# Patient Record
Sex: Female | Born: 1975 | Race: White | Hispanic: No | Smoking: Current every day smoker
Health system: Southern US, Community
[De-identification: ages and names within clinical notes are randomized; demographics above are authoritative.]

## PROBLEM LIST (undated history)

## (undated) DIAGNOSIS — R1013 Epigastric pain: Secondary | ICD-10-CM

## (undated) DIAGNOSIS — F419 Anxiety disorder, unspecified: Secondary | ICD-10-CM

## (undated) DIAGNOSIS — J45909 Unspecified asthma, uncomplicated: Secondary | ICD-10-CM

## (undated) DIAGNOSIS — E7800 Pure hypercholesterolemia, unspecified: Secondary | ICD-10-CM

## (undated) DIAGNOSIS — G43909 Migraine, unspecified, not intractable, without status migrainosus: Secondary | ICD-10-CM

## (undated) DIAGNOSIS — F329 Major depressive disorder, single episode, unspecified: Secondary | ICD-10-CM

## (undated) DIAGNOSIS — J449 Chronic obstructive pulmonary disease, unspecified: Secondary | ICD-10-CM

## (undated) DIAGNOSIS — M6289 Other specified disorders of muscle: Secondary | ICD-10-CM

## (undated) DIAGNOSIS — F32A Depression, unspecified: Secondary | ICD-10-CM

## (undated) HISTORY — DX: Depression, unspecified: F32.A

## (undated) HISTORY — DX: Other specified disorders of muscle: M62.89

## (undated) HISTORY — DX: Chronic obstructive pulmonary disease, unspecified: J44.9

## (undated) HISTORY — PX: WISDOM TOOTH EXTRACTION: SHX21

## (undated) HISTORY — DX: Anxiety disorder, unspecified: F41.9

## (undated) HISTORY — PX: OTHER SURGICAL HISTORY: SHX169

## (undated) HISTORY — DX: Pure hypercholesterolemia, unspecified: E78.00

## (undated) HISTORY — DX: Epigastric pain: R10.13

## (undated) HISTORY — DX: Unspecified asthma, uncomplicated: J45.909

## (undated) HISTORY — PX: TONSILLECTOMY: SUR1361

## (undated) HISTORY — PX: CHOLECYSTECTOMY: SHX55

## (undated) HISTORY — DX: Migraine, unspecified, not intractable, without status migrainosus: G43.909

---
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
## 2015-03-07 NOTE — Progress Notes (Unsigned)
B/P : 113/83   Pulse: 82   Taken on 12/09/2014 By: Samule Dry, RN

## 2017-10-13 ENCOUNTER — Other Ambulatory Visit (HOSPITAL_COMMUNITY): Payer: Self-pay | Admitting: Family Medicine

## 2017-10-13 DIAGNOSIS — R1013 Epigastric pain: Secondary | ICD-10-CM

## 2017-10-19 ENCOUNTER — Encounter (INDEPENDENT_AMBULATORY_CARE_PROVIDER_SITE_OTHER): Payer: Self-pay | Admitting: Internal Medicine

## 2017-10-19 ENCOUNTER — Ambulatory Visit (INDEPENDENT_AMBULATORY_CARE_PROVIDER_SITE_OTHER): Payer: BLUE CROSS/BLUE SHIELD | Admitting: Internal Medicine

## 2017-10-19 ENCOUNTER — Encounter (INDEPENDENT_AMBULATORY_CARE_PROVIDER_SITE_OTHER): Payer: Self-pay | Admitting: *Deleted

## 2017-10-19 VITALS — BP 122/80 | HR 72 | Temp 97.7°F | Ht 65.0 in | Wt 231.5 lb

## 2017-10-19 DIAGNOSIS — K219 Gastro-esophageal reflux disease without esophagitis: Secondary | ICD-10-CM

## 2017-10-19 DIAGNOSIS — R1013 Epigastric pain: Secondary | ICD-10-CM | POA: Diagnosis not present

## 2017-10-19 HISTORY — DX: Epigastric pain: R10.13

## 2017-10-19 MED ORDER — PANTOPRAZOLE SODIUM 40 MG PO TBEC: 40.0000 mg | DELAYED_RELEASE_TABLET | Freq: Two times a day (BID) | ORAL | 3 refills | Status: DC

## 2017-10-19 NOTE — Patient Instructions (Addendum)
Stop the Omeprazole. Start the Protonix 40mg  BID. You can stop the Hydrocodone also.  Stop the Motrin Excedrine Migraine.   EGD.

## 2017-10-19 NOTE — Progress Notes (Signed)
   Subjective:    Patient ID: Kathryn Rodriguez, female    DOB: 15-Mar-1975, 42 y.o.   MRN: [id number]  HPI Referred by Dr. Karie Kirks for abdominal pain.  Pain middle of abdomen. Symptoms for about a month. Pain continuous but worse about 20 minutes after eating. Has been taking Omeprazole BID. She points to her epigastric and umbilical region where her pain is located. Has been seen at [facility] for same in July of this year. She was told she had gastritis or an ulcer. She continued the Omeprazole and started the Carafate. There was no change in her symptoms. She has tried a bland diet and this has not helped. No weight loss. Appetite not good. It hurts to eat.  Has a BM every four days since her symptoms started and she started the pain medication. She is taking a stool softener for her constipation. She is scheduled for CT scan on 10/26/2017. Had been taking Motrin and Excedrin everyday for her back aches and headaches.  She was taking Motrin 800mg  once or twice a day and Excedrin Migraine twice a day. Stopped the Motrin 2 weeks ago but has continued the Excedrin Migraine. Has only taken the Excedrin once or twice since symptoms started.  Family hx of colon cancer in a grandfather in his 18s.       Review of Systems Past Medical History:  Diagnosis Date  . Abdominal pain, epigastric 10/19/2017  . Asthma   . COPD (chronic obstructive pulmonary disease) ([place])   . Migraines       No Known Allergies  Current Outpatient Medications on File Prior to Visit  Medication Sig Dispense Refill  . ALPRAZolam (XANAX) 1 MG tablet Take 1 mg by mouth 3 (three) times daily.    Marland Kitchen aspirin-acetaminophen-caffeine (EXCEDRIN MIGRAINE) 250-250-65 MG tablet Take by mouth every 6 (six) hours as needed for headache.    . cetirizine (ZYRTEC) 10 MG tablet Take 10 mg by mouth daily.    Marland Kitchen HYDROcodone-acetaminophen (NORCO) 10-325 MG tablet Take 1 tablet by mouth at bedtime.    Marland Kitchen HYDROcodone-acetaminophen  (NORCO/VICODIN) 5-325 MG tablet Take 1 tablet by mouth every 6 (six) hours as needed for moderate pain.    Marland Kitchen omeprazole (PRILOSEC) 40 MG capsule Take 40 mg by mouth daily.    . ondansetron (ZOFRAN) 4 MG tablet Take 4 mg by mouth every 8 (eight) hours as needed for nausea or vomiting.    . topiramate (TOPAMAX) 100 MG tablet Take 100 mg by mouth 2 (two) times daily.     No current facility-administered medications on file prior to visit.         Objective:   Physical Exam Blood pressure 122/80, pulse 72, temperature 97.7 F (36.5 C), height 5\' 5"  (1.651 m), weight 231 lb 8 oz (105 kg). Alert and oriented. Skin warm and dry. Oral mucosa is moist.   . Sclera anicteric, conjunctivae is pink. Thyroid not enlarged. No cervical lymphadenopathy. Lungs clear. Heart regular rate and rhythm.  Abdomen is soft. Bowel sounds are positive. No hepatomegaly. No abdominal masses felt. Epigastric tenderness.  No edema to lower extremities.           Assessment & Plan:  Epigastric pain. GERD. Needs EGD to rule GERD. The risks of bleeding, perforation and infection were reviewed with patient. Family hx of colon cancer. Will schedule Colonoscopy in the near future.  Stop the Excedrin Migraine, Motrin, and Hydrocodone.

## 2017-10-26 ENCOUNTER — Encounter (HOSPITAL_COMMUNITY)
Admission: RE | Disposition: A | Discharge status: Home / Self Care | Payer: Self-pay | Source: Ambulatory Visit | Attending: Internal Medicine

## 2017-10-26 ENCOUNTER — Ambulatory Visit (HOSPITAL_COMMUNITY)
Admission: RE | Admit: 2017-10-26 | Discharge: 2017-10-26 | Disposition: A | Discharge status: Home / Self Care | Payer: BLUE CROSS/BLUE SHIELD | Source: Ambulatory Visit | Attending: Family Medicine | Admitting: Family Medicine

## 2017-10-26 ENCOUNTER — Ambulatory Visit (HOSPITAL_COMMUNITY)
Admission: RE | Admit: 2017-10-26 | Discharge: 2017-10-26 | Disposition: A | Discharge status: Home / Self Care | Payer: BLUE CROSS/BLUE SHIELD | Source: Ambulatory Visit | Attending: Internal Medicine | Admitting: Internal Medicine

## 2017-10-26 ENCOUNTER — Other Ambulatory Visit: Payer: Self-pay

## 2017-10-26 ENCOUNTER — Encounter (HOSPITAL_COMMUNITY): Payer: Self-pay | Admitting: *Deleted

## 2017-10-26 DIAGNOSIS — J449 Chronic obstructive pulmonary disease, unspecified: Secondary | ICD-10-CM | POA: Insufficient documentation

## 2017-10-26 DIAGNOSIS — F17210 Nicotine dependence, cigarettes, uncomplicated: Secondary | ICD-10-CM | POA: Insufficient documentation

## 2017-10-26 DIAGNOSIS — K449 Diaphragmatic hernia without obstruction or gangrene: Secondary | ICD-10-CM | POA: Insufficient documentation

## 2017-10-26 DIAGNOSIS — K210 Gastro-esophageal reflux disease with esophagitis: Secondary | ICD-10-CM | POA: Insufficient documentation

## 2017-10-26 DIAGNOSIS — Z7951 Long term (current) use of inhaled steroids: Secondary | ICD-10-CM | POA: Diagnosis not present

## 2017-10-26 DIAGNOSIS — G43909 Migraine, unspecified, not intractable, without status migrainosus: Secondary | ICD-10-CM | POA: Insufficient documentation

## 2017-10-26 DIAGNOSIS — K228 Other specified diseases of esophagus: Secondary | ICD-10-CM | POA: Insufficient documentation

## 2017-10-26 DIAGNOSIS — R1013 Epigastric pain: Secondary | ICD-10-CM | POA: Diagnosis present

## 2017-10-26 DIAGNOSIS — Z8711 Personal history of peptic ulcer disease: Secondary | ICD-10-CM | POA: Diagnosis not present

## 2017-10-26 DIAGNOSIS — K219 Gastro-esophageal reflux disease without esophagitis: Secondary | ICD-10-CM | POA: Insufficient documentation

## 2017-10-26 HISTORY — PX: ESOPHAGOGASTRODUODENOSCOPY: SHX5428

## 2017-10-26 HISTORY — PX: BIOPSY: SHX5522

## 2017-10-26 SURGERY — EGD (ESOPHAGOGASTRODUODENOSCOPY)
Anesthesia: Moderate Sedation

## 2017-10-26 MED ORDER — IOPAMIDOL (ISOVUE-300) INJECTION 61%
100.0000 mL | Freq: Once | INTRAVENOUS | Status: AC | PRN
  Administered 2017-10-26: 100 mL via INTRAVENOUS

## 2017-10-26 MED ORDER — MIDAZOLAM HCL 5 MG/5ML IJ SOLN
INTRAMUSCULAR | Status: DC | PRN
  Administered 2017-10-26: 1 mg via INTRAVENOUS
  Administered 2017-10-26: 3 mg via INTRAVENOUS
  Administered 2017-10-26 (×2): 2 mg via INTRAVENOUS

## 2017-10-26 MED ORDER — SODIUM CHLORIDE 0.9 % IV SOLN
INTRAVENOUS | Status: DC
  Administered 2017-10-26: 11:00:00 via INTRAVENOUS

## 2017-10-26 MED ORDER — LIDOCAINE VISCOUS HCL 2 % MT SOLN
OROMUCOSAL | Status: DC | PRN
  Administered 2017-10-26: 1 via OROMUCOSAL

## 2017-10-26 MED ORDER — LIDOCAINE VISCOUS HCL 2 % MT SOLN
OROMUCOSAL | Status: AC
  Filled 2017-10-26: qty 15

## 2017-10-26 MED ORDER — MEPERIDINE HCL 50 MG/ML IJ SOLN
INTRAMUSCULAR | Status: DC | PRN
  Administered 2017-10-26 (×2): 25 mg

## 2017-10-26 MED ORDER — METOCLOPRAMIDE HCL 10 MG PO TABS: 10.0000 mg | ORAL_TABLET | Freq: Three times a day (TID) | ORAL | 0 refills | Status: DC

## 2017-10-26 MED ORDER — MIDAZOLAM HCL 5 MG/5ML IJ SOLN
INTRAMUSCULAR | Status: AC
  Filled 2017-10-26: qty 10

## 2017-10-26 MED ORDER — MEPERIDINE HCL 50 MG/ML IJ SOLN
INTRAMUSCULAR | Status: AC
  Filled 2017-10-26: qty 1

## 2017-10-26 NOTE — Op Note (Signed)
[facility] Patient Name: Kathryn Rodriguez Procedure Date: 10/26/2017 11:26 AM MRN: [id number] Date of Birth: 08/22/1975 Attending MD: Hildred Laser , MD CSN: [id number] Age: 42 Admit Type: Outpatient Procedure:                Upper GI endoscopy Indications:              Epigastric abdominal pain Providers:                Hildred Laser, MD, Janeece Riggers, RN, Nelma Rothman,                            Technician Referring MD:             Lemmie Evens, MD Medicines:                Lidocaine spray, Meperidine 50 mg IV, Midazolam 8                            mg IV Complications:            No immediate complications. Estimated Blood Loss:     Estimated blood loss was minimal. Procedure:                Pre-Anesthesia Assessment:                           - Prior to the procedure, a History and Physical                            was performed, and patient medications and                            allergies were reviewed. The patient's tolerance of                            previous anesthesia was also reviewed. The risks                            and benefits of the procedure and the sedation                            options and risks were discussed with the patient.                            All questions were answered, and informed consent                            was obtained. Prior Anticoagulants: The patient                            last took previous NSAID medication 1 day prior to                            the procedure. ASA Grade Assessment: II - A patient  with mild systemic disease. After reviewing the                            risks and benefits, the patient was deemed in                            satisfactory condition to undergo the procedure.                           After obtaining informed consent, the endoscope was                            passed under direct vision. Throughout the                            procedure, the patient's  blood pressure, pulse, and                            oxygen saturations were monitored continuously. The                            [id number]-[id number] ([id number]) scope was introduced through the                            mouth, and advanced to the second part of duodenum.                            The upper GI endoscopy was accomplished without                            difficulty. The patient tolerated the procedure                            well. Scope In: 12:00:08 PM Scope Out: 12:08:33 PM Total Procedure Duration: 0 hours 8 minutes 25 seconds  Findings:      The examined esophagus was normal.      The Z-line was irregular and was found 34 cm from the incisors.      A 2 cm hiatal hernia was present.      A large amount of food (residue) was found in the gastric body and on       the anterior wall of the stomach.      The exam of the stomach was otherwise normal.      The pylorus was normal.      The duodenal bulb and second portion of the duodenum were normal. Impression:               - Normal esophagus.                           - Z-line irregular, 34 cm from the incisors.                           - 2 cm hiatal hernia.                           -  A large amount of food (residue) in the stomach.                           - Normal pylorus.                           - Normal duodenal bulb and second portion of the                            duodenum.                           - No specimens collected. Moderate Sedation:      Moderate (conscious) sedation was administered by the endoscopy nurse       and supervised by the endoscopist. The following parameters were       monitored: oxygen saturation, heart rate, blood pressure, CO2       capnography and response to care. Total physician intraservice time was       16 minutes. Recommendation:           - Patient has a contact number available for                            emergencies. The signs and symptoms of potential                             delayed complications were discussed with the                            patient. Return to normal activities tomorrow.                            Written discharge instructions were provided to the                            patient.                           - Clear liquid diet today.                           - Low fat diet today.                           - Continue present medications.                           - Metoclopromid 10 mg po ac.                           - Await pathology results.                           - Pancreatic EUS to bescheduled. Procedure Code(s):        --- Professional ---  01027, Esophagogastroduodenoscopy, flexible,                            transoral; diagnostic, including collection of                            specimen(s) by brushing or washing, when performed                            (separate procedure)                           G0500, Moderate sedation services provided by the                            same physician or other qualified health care                            professional performing a gastrointestinal                            endoscopic service that sedation supports,                            requiring the presence of an independent trained                            observer to assist in the monitoring of the                            patient's level of consciousness and physiological                            status; initial 15 minutes of intra-service time;                            patient age 32 years or older (additional time may                            be reported with [phone], as appropriate) Diagnosis Code(s):        --- Professional ---                           K22.8, Other specified diseases of esophagus                           K44.9, Diaphragmatic hernia without obstruction or                            gangrene                           R10.13, Epigastric pain CPT copyright  2017 American Medical Association. All rights reserved. The codes documented in this report are preliminary and upon coder review may  be revised to meet current compliance requirements. Hildred Laser, MD  Hildred Laser, MD 10/26/2017 12:21:01 PM This report has been signed electronically. Number of Addenda: 0

## 2017-10-26 NOTE — Discharge Instructions (Signed)
Keep Excedrin use to minimum. Resume other medications as before. Metoclopramide 10 mg by mouth 30 minutes before each meal.  If you experience tremors or other side effects stop the medication and call office. Clear liquids today. Begin low-fat diet starting tomorrow. No driving for 24 hours. Physician will call with biopsy results. Pancreatic endoscopic ultrasound to be scheduled.  Office will call. No aspirin products for twenty-four hours.  Upper Endoscopy, Care After Refer to this sheet in the next few weeks. These instructions provide you with information about caring for yourself after your procedure. Your health care provider may also give you more specific instructions. Your treatment has been planned according to current medical practices, but problems sometimes occur. Call your health care provider if you have any problems or questions after your procedure. What can I expect after the procedure? After the procedure, it is common to have:  A sore throat.  Bloating.  Nausea.  Follow these instructions at home:  Follow instructions from your health care provider about what to eat or drink after your procedure.  Return to your normal activities as told by your health care provider. Ask your health care provider what activities are safe for you.  Take over-the-counter and prescription medicines only as told by your health care provider.  Do not drive for 24 hours if you received a sedative.  Keep all follow-up visits as told by your health care provider. This is important. Contact a health care provider if:  You have a sore throat that lasts longer than one day.  You have trouble swallowing. Get help right away if:  You have a fever.  You vomit blood or your vomit looks like coffee grounds.  You have bloody, black, or tarry stools.  You have a severe sore throat or you cannot swallow.  You have difficulty breathing.  You have severe pain in your chest or  belly.   Low-Fat Diet for Pancreatitis or Gallbladder Conditions A low-fat diet can be helpful if you have pancreatitis or a gallbladder condition. With these conditions, your pancreas and gallbladder have trouble digesting fats. A healthy eating plan with less fat will help rest your pancreas and gallbladder and reduce your symptoms. What do I need to know about this diet?  Eat a low-fat diet. ? Reduce your fat intake to less than 20-30% of your total daily calories. This is less than 50-60 g of fat per day. ? Remember that you need some fat in your diet. Ask your dietician what your daily goal should be. ? Choose nonfat and low-fat healthy foods. Look for the words nonfat, low fat, or fat free. ? As a guide, look on the label and choose foods with less than 3 g of fat per serving. Eat only one serving.  Avoid alcohol.  Do not smoke. If you need help quitting, talk with your health care provider.  Eat small frequent meals instead of three large heavy meals. What foods can I eat? Grains Include healthy grains and starches such as potatoes, wheat bread, fiber-rich cereal, and brown rice. Choose whole grain options whenever possible. In adults, whole grains should account for 45-65% of your daily calories. Fruits and Vegetables Eat plenty of fruits and vegetables. Fresh fruits and vegetables add fiber to your diet. Meats and Other Protein Sources Eat lean meat such as chicken and pork. Trim any fat off of meat before cooking it. Eggs, fish, and beans are other sources of protein. In adults, these foods should account  for 10-35% of your daily calories. Dairy Choose low-fat milk and dairy options. Dairy includes fat and protein, as well as calcium. Fats and Oils Limit high-fat foods such as fried foods, sweets, baked goods, sugary drinks. Other Creamy sauces and condiments, such as mayonnaise, can add extra fat. Think about whether or not you need to use them, or use smaller amounts  or low fat options. What foods are not recommended?  High fat foods, such as: ? [employer]. ? Ice cream. ? [place] toast. ? Sweet rolls. ? Pizza. ? Cheese bread. ? Foods covered with batter, butter, creamy sauces, or cheese. ? Fried foods. ? Sugary drinks and desserts.  Foods that cause gas or bloating

## 2017-10-26 NOTE — H&P (Signed)
Kathryn Rodriguez is an 42 y.o. female.   Chief Complaint: Patient is here for EGD. HPI: Patient is a 42 year old Caucasian female who is been experiencing epigastric pain for 6 to 7 weeks.  Pain is tries to be sharp crampy and radiates inferiorly and hypogastric region.  She has had nausea but no vomiting.  She has not felt any better with therapy for peptic ulcer disease.  She was seen in emergency room at [facility] and told she had gastritis or ulcer.  She had blood work which is unremarkable.  She has noted more pain with ice cream.  She has been on ibuprofen since she was 42 years old.  She generally takes a couple of doses a week.  She also has been taking Excedrin as many as 2/day for migraine.  She denies melena or rectal bleeding or weight loss. Just had abdominal pelvic CT which reveals lesion in left lobe of liver consistent with hemangioma.  She also has some hypodense lesion in superior aspect of pancreatic body measuring 24 x 14 mm.  There is no duct dilation or inflammatory changes surrounding this lesion. Patient smokes 1 pack of cigarettes per day and drinks alcohol occasionally.  Past Medical History:  Diagnosis Date  . Abdominal pain, epigastric 10/19/2017  . Asthma   . COPD (chronic obstructive pulmonary disease) ([place])   . Migraines     Past Surgical History:  Procedure Laterality Date  . c sections     x 3  . CHOLECYSTECTOMY    . complete hysterectomy    . TONSILLECTOMY    . WISDOM TOOTH EXTRACTION      History reviewed. No pertinent family history. Social History:  reports that she has been smoking. She has never used smokeless tobacco. She reports that she does not drink alcohol. Her drug history is not on file.  Allergies: No Known Allergies  Medications Prior to Admission  Medication Sig Dispense Refill  . ALPRAZolam (XANAX) 1 MG tablet Take 1 mg by mouth 3 (three) times daily.    Marland Kitchen aspirin-acetaminophen-caffeine (EXCEDRIN MIGRAINE) 250-250-65 MG tablet  Take 1 tablet by mouth every 6 (six) hours as needed for headache.     . cetirizine (ZYRTEC) 10 MG tablet Take 10 mg by mouth daily.    Marland Kitchen docusate sodium (COLACE) 100 MG capsule Take 100 mg by mouth daily as needed for mild constipation.    Marland Kitchen HYDROcodone-acetaminophen (NORCO) 10-325 MG tablet Take 1 tablet by mouth at bedtime.    Marland Kitchen HYDROcodone-acetaminophen (NORCO/VICODIN) 5-325 MG tablet Take 0.5-1 tablets by mouth every 6 (six) hours as needed for moderate pain.     Marland Kitchen ondansetron (ZOFRAN) 4 MG tablet Take 4 mg by mouth every 8 (eight) hours as needed for nausea or vomiting.    . pantoprazole (PROTONIX) 40 MG tablet Take 1 tablet (40 mg total) by mouth 2 (two) times daily before a meal. 60 tablet 3  . SEREVENT DISKUS 50 MCG/DOSE diskus inhaler Inhale 2 puffs into the lungs every morning.  11  . SUMAtriptan (IMITREX) 100 MG tablet Take 100 mg by mouth every 2 (two) hours as needed for migraine. May repeat in 2 hours if headache persists or recurs.    . topiramate (TOPAMAX) 100 MG tablet Take 100 mg by mouth 2 (two) times daily.    . VENTOLIN HFA 108 (90 Base) MCG/ACT inhaler Inhale 2 puffs into the lungs every 4 (four) hours as needed for wheezing.   11    No  results found for this or any previous visit (from the past 8 hour(s)). Ct Abdomen Pelvis W Contrast  Result Date: 10/26/2017 CLINICAL DATA:  Epigastric pain radiating into the umbilicus for 1 month. EXAM: CT ABDOMEN AND PELVIS WITH CONTRAST TECHNIQUE: Multidetector CT imaging of the abdomen and pelvis was performed using the standard protocol following bolus administration of intravenous contrast. CONTRAST:  147mL ISOVUE-300 IOPAMIDOL (ISOVUE-300) INJECTION 61% COMPARISON:  None. FINDINGS: Lower chest: No acute abnormality. Hepatobiliary: 2 cm hypodense indeterminate left hepatic mass measuring 39 Hounsfield units with mild peripheral nodular enhancement suggesting a hemangioma. No other focal hepatic mass. No intrahepatic or extrahepatic  biliary ductal dilatation. Prior cholecystectomy. Pancreas: No pancreatic ductal dilatation or surrounding inflammatory changes. 2.4 x 1.4 cm hypodense mass along the superior margin of the mid body of the pancreas. Spleen: Normal in size without focal abnormality. Adrenals/Urinary Tract: Adrenal glands are unremarkable. 7 mm hypodense mass in the anterior interpolar aspect of the right kidney likely reflecting a small cyst, but too small the characterize. Kidneys are otherwise normal, without renal calculi, focal lesion, or hydronephrosis. Bladder is unremarkable. Stomach/Bowel: Stomach is within normal limits. Appendix appears normal. No evidence of bowel wall thickening, distention, or inflammatory changes. Vascular/Lymphatic: Normal caliber abdominal aorta with mild atherosclerosis. No lymphadenopathy. Reproductive: Status post hysterectomy. No adnexal masses. Other: No abdominal wall hernia or abnormality. No abdominopelvic ascites. Musculoskeletal: No acute osseous abnormality. No aggressive osseous lesion. IMPRESSION: 1. No acute abdominal or pelvic pathology. 2. 2.4 x 1.4 cm hypodense mass along the superior margin of the mid body of the pancreas. Recommend further evaluation with MRI of the abdomen without and with intravenous contrast. 3.  Aortic Atherosclerosis (ICD10-I70.0). Electronically Signed   By: Kathreen Devoid   On: 10/26/2017 09:23    ROS  Blood pressure 111/83, pulse (!) 53, temperature 97.8 F (36.6 C), temperature source Oral, resp. rate 16, SpO2 100 %. Physical Exam  Constitutional: She appears well-developed and well-nourished.  HENT:  Mouth/Throat: Oropharynx is clear and moist.  Eyes: Conjunctivae are normal. No scleral icterus.  Neck: No thyromegaly present.  Cardiovascular: Normal rate, regular rhythm and normal heart sounds.  No murmur heard. Respiratory: Effort normal and breath sounds normal.  GI:  Abdomen is full.  She has laparoscopy scars as well as Pfannenstiel scar  across lower abdomen.  Abdomen is soft with mild midepigastric tenderness.  No organomegaly or masses  Musculoskeletal: She exhibits no edema.  Lymphadenopathy:    She has no cervical adenopathy.  Neurological: She is alert.  Skin: Skin is warm.  She has a tattoo over left leg.     Assessment/Plan Epigastric pain. History of NSAID use. Diagnostic EGD.  Hildred Laser, MD 10/26/2017, 11:45 AM

## 2017-10-29 ENCOUNTER — Encounter (HOSPITAL_COMMUNITY): Payer: Self-pay | Admitting: Internal Medicine

## 2017-10-30 ENCOUNTER — Telehealth: Payer: Self-pay | Admitting: Gastroenterology

## 2017-10-30 ENCOUNTER — Other Ambulatory Visit (HOSPITAL_COMMUNITY): Payer: Self-pay | Admitting: Family Medicine

## 2017-10-30 ENCOUNTER — Other Ambulatory Visit: Payer: Self-pay

## 2017-10-30 ENCOUNTER — Ambulatory Visit (HOSPITAL_COMMUNITY)
Admission: RE | Admit: 2017-10-30 | Discharge: 2017-10-30 | Disposition: A | Discharge status: Home / Self Care | Payer: BLUE CROSS/BLUE SHIELD | Source: Ambulatory Visit | Attending: Family Medicine | Admitting: Family Medicine

## 2017-10-30 DIAGNOSIS — M545 Low back pain: Secondary | ICD-10-CM | POA: Insufficient documentation

## 2017-10-30 DIAGNOSIS — R52 Pain, unspecified: Secondary | ICD-10-CM

## 2017-10-30 DIAGNOSIS — M5136 Other intervertebral disc degeneration, lumbar region: Secondary | ICD-10-CM | POA: Diagnosis not present

## 2017-10-30 DIAGNOSIS — K8689 Other specified diseases of pancreas: Secondary | ICD-10-CM

## 2017-10-30 DIAGNOSIS — M546 Pain in thoracic spine: Secondary | ICD-10-CM | POA: Insufficient documentation

## 2017-10-30 NOTE — Telephone Encounter (Signed)
EUS scheduled for 11/18/17 11 am [facility], pt instructed and medications reviewed.  Patient instructions mailed to home.  Patient to call with any questions or concerns.

## 2017-10-30 NOTE — Telephone Encounter (Signed)
-----   Message from Timothy Lasso, RN sent at 10/30/2017  8:50 AM EDT ----- Regarding: FW: pancreatic EUS   ----- Message ----- From: Worthy Keeler Sent: 10/30/2017   8:27 AM EDT To: Timothy Lasso, RN Subject: pancreatic EUS                                 Patient needs pancreatic EUS, pancreatic mass -- all records in [facility]

## 2017-10-30 NOTE — Telephone Encounter (Signed)
   I reviewed her CT scan.  This is a solid pancreatic, possibly peripancreatic lesion and I think endoscopic ultrasound with fine-needle aspiration is a very good next step.   Patty, can you arrange for upper EUS with MAC sedation first available appointment of either Dr. Rush Landmark or myself for pancreatic mass.  I see no reason for any further blood tests or imaging studies prior to then.   Wynetta Fines

## 2017-11-17 NOTE — Anesthesia Preprocedure Evaluation (Addendum)
Anesthesia Evaluation  Patient identified by MRN, date of birth, ID band Patient awake    Reviewed: Allergy & Precautions, NPO status   Airway Mallampati: II  TM Distance: >3 FB Neck ROM: Full    Dental no notable dental hx. (+) Poor Dentition, Dental Advisory Given   Pulmonary asthma , COPD,  COPD inhaler, Current Smoker,    Pulmonary exam normal breath sounds clear to auscultation       Cardiovascular negative cardio ROS Normal cardiovascular exam Rhythm:Regular Rate:Normal     Neuro/Psych  Headaches, negative psych ROS   GI/Hepatic Neg liver ROS, GERD  ,  Endo/Other    Renal/GU      Musculoskeletal   Abdominal (+) + obese,   Peds  Hematology negative hematology ROS (+)   Anesthesia Other Findings   Reproductive/Obstetrics negative OB ROS                            Anesthesia Physical Anesthesia Plan  ASA: III  Anesthesia Plan: MAC   Post-op Pain Management:    Induction: Intravenous  PONV Risk Score and Plan: Treatment may vary due to age or medical condition  Airway Management Planned: Natural Airway, Mask and Nasal Cannula  Additional Equipment:   Intra-op Plan:   Post-operative Plan:   Informed Consent: I have reviewed the patients History and Physical, chart, labs and discussed the procedure including the risks, benefits and alternatives for the proposed anesthesia with the patient or authorized representative who has indicated his/her understanding and acceptance.   Dental advisory given  Plan Discussed with:   Anesthesia Plan Comments:         Anesthesia Quick Evaluation

## 2017-11-18 ENCOUNTER — Other Ambulatory Visit: Payer: Self-pay

## 2017-11-18 ENCOUNTER — Ambulatory Visit (HOSPITAL_COMMUNITY)
Admission: RE | Admit: 2017-11-18 | Discharge: 2017-11-18 | Disposition: A | Discharge status: Home / Self Care | Payer: BLUE CROSS/BLUE SHIELD | Source: Ambulatory Visit | Attending: Gastroenterology | Admitting: Gastroenterology

## 2017-11-18 ENCOUNTER — Ambulatory Visit (HOSPITAL_COMMUNITY): Payer: BLUE CROSS/BLUE SHIELD | Admitting: Anesthesiology

## 2017-11-18 ENCOUNTER — Encounter (HOSPITAL_COMMUNITY)
Admission: RE | Disposition: A | Discharge status: Home / Self Care | Payer: Self-pay | Source: Ambulatory Visit | Attending: Gastroenterology

## 2017-11-18 ENCOUNTER — Other Ambulatory Visit: Payer: Self-pay | Admitting: Gastroenterology

## 2017-11-18 ENCOUNTER — Encounter (HOSPITAL_COMMUNITY): Payer: Self-pay | Admitting: Certified Registered"

## 2017-11-18 DIAGNOSIS — Z90710 Acquired absence of both cervix and uterus: Secondary | ICD-10-CM | POA: Insufficient documentation

## 2017-11-18 DIAGNOSIS — R935 Abnormal findings on diagnostic imaging of other abdominal regions, including retroperitoneum: Secondary | ICD-10-CM

## 2017-11-18 DIAGNOSIS — K769 Liver disease, unspecified: Secondary | ICD-10-CM | POA: Diagnosis not present

## 2017-11-18 DIAGNOSIS — R1013 Epigastric pain: Secondary | ICD-10-CM

## 2017-11-18 DIAGNOSIS — F17200 Nicotine dependence, unspecified, uncomplicated: Secondary | ICD-10-CM | POA: Insufficient documentation

## 2017-11-18 DIAGNOSIS — K862 Cyst of pancreas: Secondary | ICD-10-CM | POA: Diagnosis not present

## 2017-11-18 DIAGNOSIS — K219 Gastro-esophageal reflux disease without esophagitis: Secondary | ICD-10-CM

## 2017-11-18 DIAGNOSIS — J449 Chronic obstructive pulmonary disease, unspecified: Secondary | ICD-10-CM | POA: Insufficient documentation

## 2017-11-18 DIAGNOSIS — K8689 Other specified diseases of pancreas: Secondary | ICD-10-CM | POA: Diagnosis not present

## 2017-11-18 HISTORY — PX: ESOPHAGOGASTRODUODENOSCOPY: SHX5428

## 2017-11-18 HISTORY — PX: EUS: SHX5427

## 2017-11-18 HISTORY — PX: FINE NEEDLE ASPIRATION: SHX5430

## 2017-11-18 SURGERY — UPPER ENDOSCOPIC ULTRASOUND (EUS) LINEAR
Anesthesia: Monitor Anesthesia Care

## 2017-11-18 MED ORDER — PROPOFOL 10 MG/ML IV BOLUS
INTRAVENOUS | Status: AC
  Filled 2017-11-18: qty 40

## 2017-11-18 MED ORDER — LACTATED RINGERS IV SOLN
INTRAVENOUS | Status: DC
  Administered 2017-11-18 (×2): via INTRAVENOUS

## 2017-11-18 MED ORDER — CIPROFLOXACIN IN D5W 400 MG/200ML IV SOLN
INTRAVENOUS | Status: DC | PRN
  Administered 2017-11-18: 400 mg via INTRAVENOUS

## 2017-11-18 MED ORDER — CIPROFLOXACIN IN D5W 400 MG/200ML IV SOLN
INTRAVENOUS | Status: AC
  Filled 2017-11-18: qty 200

## 2017-11-18 MED ORDER — FENTANYL CITRATE (PF) 100 MCG/2ML IJ SOLN
INTRAMUSCULAR | Status: AC
  Filled 2017-11-18: qty 2

## 2017-11-18 MED ORDER — FENTANYL CITRATE (PF) 100 MCG/2ML IJ SOLN
INTRAMUSCULAR | Status: DC | PRN
  Administered 2017-11-18: 50 ug via INTRAVENOUS

## 2017-11-18 MED ORDER — SODIUM CHLORIDE 0.9 % IV SOLN: INTRAVENOUS | Status: DC

## 2017-11-18 MED ORDER — PROPOFOL 10 MG/ML IV BOLUS
INTRAVENOUS | Status: DC | PRN
  Administered 2017-11-18 (×8): 20 mg via INTRAVENOUS
  Administered 2017-11-18: 10 mg via INTRAVENOUS
  Administered 2017-11-18: 20 mg via INTRAVENOUS
  Administered 2017-11-18: 50 mg via INTRAVENOUS
  Administered 2017-11-18 (×3): 20 mg via INTRAVENOUS
  Administered 2017-11-18: 30 mg via INTRAVENOUS
  Administered 2017-11-18: 20 mg via INTRAVENOUS

## 2017-11-18 MED ORDER — CIPROFLOXACIN HCL 500 MG PO TABS: 500.0000 mg | ORAL_TABLET | Freq: Two times a day (BID) | ORAL | 0 refills | Status: AC

## 2017-11-18 MED ORDER — ONDANSETRON HCL 4 MG/2ML IJ SOLN
INTRAMUSCULAR | Status: DC | PRN
  Administered 2017-11-18: 4 mg via INTRAVENOUS

## 2017-11-18 MED ORDER — LIDOCAINE 2% (20 MG/ML) 5 ML SYRINGE
INTRAMUSCULAR | Status: DC | PRN
  Administered 2017-11-18: 60 mg via INTRAVENOUS

## 2017-11-18 NOTE — Anesthesia Postprocedure Evaluation (Signed)
Anesthesia Post Note  Patient: Kathryn Rodriguez  Procedure(s) Performed: UPPER ENDOSCOPIC ULTRASOUND (EUS) LINEAR (N/A ) FINE NEEDLE ASPIRATION (FNA) LINEAR     Patient location during evaluation: Endoscopy Anesthesia Type: MAC Level of consciousness: awake and alert Pain management: pain level controlled Vital Signs Assessment: post-procedure vital signs reviewed and stable Respiratory status: spontaneous breathing, nonlabored ventilation, respiratory function stable and patient connected to nasal cannula oxygen Cardiovascular status: stable and blood pressure returned to baseline Postop Assessment: no apparent nausea or vomiting Anesthetic complications: no    Last Vitals:  Vitals:   11/18/17 1220 11/18/17 1230  BP: 130/77 112/77  Pulse: 66 (!) 51  Resp: 15 18  Temp:    SpO2: 98% 96%    Last Pain:  Vitals:   11/18/17 1230  TempSrc:   PainSc: 0-No pain                 Barnet Glasgow

## 2017-11-18 NOTE — Op Note (Addendum)
[facility] Patient Name: Kathryn Rodriguez Procedure Date: 11/18/2017 MRN: [id number] Attending MD: Justice Britain , MD Date of Birth: [birth year] CSN: [id number] Age: 42 Admit Type: Inpatient Procedure:                Upper EUS Indications:              Suspected mass in pancreas on CT scan, Generalized                            abdominal pain Providers:                Justice Britain, MD, Cleda Daub, RN, Alan Mulder, Technician, Glenis Smoker, CRNA Referring MD:             Hildred Laser, MD, Lemmie Evens Medicines:                Monitored Anesthesia Care Complications:            No immediate complications. Estimated Blood Loss:     Estimated blood loss was minimal. Procedure:                Pre-Anesthesia Assessment:                           - Prior to the procedure, a History and Physical                            was performed, and patient medications and                            allergies were reviewed. The patient's tolerance of                            previous anesthesia was also reviewed. The risks                            and benefits of the procedure and the sedation                            options and risks were discussed with the patient.                            All questions were answered, and informed consent                            was obtained. Prior Anticoagulants: The patient has                            taken no previous anticoagulant or antiplatelet                            agents. ASA Grade Assessment: II - A patient with  mild systemic disease. After reviewing the risks                            and benefits, the patient was deemed in                            satisfactory condition to undergo the procedure.                           After obtaining informed consent, the endoscope was                            passed under direct vision. Throughout the                           procedure, the patient's blood pressure, pulse, and                            oxygen saturations were monitored continuously. The                            [id number]-[id number] ([id number]) Olympus adult endoscope was                            introduced through the mouth, and advanced to the                            second part of duodenum. The [id number])                            Olympus Linear EUS was introduced through the                            mouth, and advanced to the duodenum for ultrasound                            examination from the stomach and duodenum. The                            upper EUS was accomplished without difficulty. The                            patient tolerated the procedure well. Scope In: Scope Out: Findings:      ENDOSCOPIC FINDING: :      No gross lesions were noted in the entire esophagus.      No gross lesions were noted in the entire examined stomach.      No gross lesions were noted in the duodenal bulb, in the first portion       of the duodenum and in the second portion of the duodenum.      ENDOSONOGRAPHIC FINDING: :      There was no sign of significant endosonographic abnormality in the       parenchyma of the pancreatic head, genu of the pancreas, pancreatic body       and  pancreatic tail. No masses, the pancreatic duct was thin in caliber.      An anechoic lesion suggestive of a cyst was identified in the pancreatic       body. It is not in obvious communication with the pancreatic duct. The       lesion measured 3.6 mm by 4.0 mm in maximal cross-sectional diameter.       There was a single compartment without septae. The outer wall of the       lesion was thin. There was no associated mass. There was no internal       debris within the fluid-filled cavity.      An oval mass was identified in the peripancreatic region near the body       of the pancreas. The lesion was heterogenous. It measured 16.7 mm by        11.5 mm in maximal cross-sectional diameter. The endosonographic borders       were well-defined. There was sonographic evidence suggesting invasion       into the splenic vein (manifested by abutment). An intact interface was       seen between the mass and the celiac trunk and portal vein suggesting a       lack of invasion. Fine needle biopsy was performed. Color Doppler       imaging was utilized prior to needle puncture to confirm a lack of       significant vascular structures within the needle path. Five passes were       made with the 25 gauge ultrasound biopsy needle using a transgastric       approach. A visible core of tissue was obtained. Preliminary cytologic       examination and touch preps were performed. Final cytology results are       pending.      Endosonographic imaging of the ampulla showed no intramural       (subepithelial) lesion or mass.      Endosonographic imaging in the common bile duct showed no stones, sludge       or dilation.      A lesion was found in the left lobe of the liver. The lesion was       heterogenous. The lesion measured 26.4 mm by 13.5 mm in maximal       cross-sectional diameter.      The celiac region was visualized. Impression:               EGD Impression:                           - No gross lesions in esophagus.                           - No gross lesions in the stomach.                           - No gross lesions in the duodenal bulb, in the                            first portion of the duodenum and in the second                            portion  of the duodenum.                           EUS Impression:                           - There was no sign of significant pathology in the                            parenchyma of the pancreatic head, genu of the                            pancreas, pancreatic body and pancreatic tail.                           - A cystic lesion was seen in the pancreatic body.                             Tissue has not been obtained. 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                           - A mass was identified in the peripancreatic                            region. Tissue was obtained from this exam, and                            results are pending. However, the endosonographic                            appearance is suspicious for potentially a necrotic                            lymph node vs benign inflammatory changes. Fine                            needle biopsy performed. Results pendinig. Query                            the possibility of a recent pancreatitis and these                            being peripancreatic collections.                           - A lesion was  found in the left lobe of the liver.                            The lesion was heterogenous. On outside imaging                            this was suggestive of a hemangioma, though not                            confirmed and sampling of this region would be                            increased risk overall and this was deferred. Moderate Sedation:      N/A- Per Anesthesia Care Recommendation:           - The patient will be observed post-procedure,                            until all discharge criteria are met.                           - Discharge patient to home.                           - Patient has a contact number available for                            emergencies. The signs and symptoms of potential                            delayed complications were discussed with the                            patient. Return to normal activities tomorrow.                            Written discharge instructions were provided to the                            patient.                           - Observe patient's  clinical course.                           - Ciprofloxacin 500 mg BID for 5-days (Rx sent to                            patient's pharmacy) due to possibility of being                            cystic lesion.                           - Await cytology results.                           -  After biopsy results return, may want to consider                            MRI-Imaging to evaluate and confirm likely                            hemangioma as well as to evaluate if the                            peripancreatic lesion is still present in a few                            weeks time.                           - The patient should have a 1-year follow up                            MRI/MRCP, no matter what to evaluate the presumed                            small pancreatic cyst that was noted to see if any                            changes are noted.                           - The findings and recommendations were discussed                            with the patient.                           - The findings and recommendations were discussed                            with the patient's family.                           - The findings and recommendations were discussed                            with the designated responsible adult.                           - Return to referring physician as previously                            scheduled. 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 All rights reserved. The codes documented in this report are preliminary and upon coder review may  be revised to meet current compliance requirements. Justice Britain, MD 11/18/2017 12:38:22 PM Number of Addenda: 0

## 2017-11-18 NOTE — Discharge Instructions (Signed)
YOU HAD AN ENDOSCOPIC PROCEDURE TODAY: Refer to the procedure report and other information in the discharge instructions given to you for any specific questions about what was found during the examination. If this information does not answer your questions, please call [place] office at [phone] to clarify.  ° °YOU SHOULD EXPECT: Some feelings of bloating in the abdomen. Passage of more gas than usual. Walking can help get rid of the air that was put into your GI tract during the procedure and reduce the bloating. If you had a lower endoscopy (such as a colonoscopy or flexible sigmoidoscopy) you may notice spotting of blood in your stool or on the toilet paper. Some abdominal soreness may be present for a day or two, also. ° °DIET: Your first meal following the procedure should be a light meal and then it is ok to progress to your normal diet. A half-sandwich or bowl of soup is an example of a good first meal. Heavy or fried foods are harder to digest and may make you feel nauseous or bloated. Drink plenty of fluids but you should avoid alcoholic beverages for 24 hours. If you had a esophageal dilation, please see attached instructions for diet.   ° °ACTIVITY: Your care partner should take you home directly after the procedure. You should plan to take it easy, moving slowly for the rest of the day. You can resume normal activity the day after the procedure however YOU SHOULD NOT DRIVE, use power tools, machinery or perform tasks that involve climbing or major physical exertion for 24 hours (because of the sedation medicines used during the test).  ° °SYMPTOMS TO REPORT IMMEDIATELY: °A gastroenterologist can be reached at any hour. Please call [phone]  for any of the following symptoms:  °Following lower endoscopy (colonoscopy, flexible sigmoidoscopy) °Excessive amounts of blood in the stool  °Significant tenderness, worsening of abdominal pains  °Swelling of the abdomen that is new, acute  °Fever of 100° or  higher  °Following upper endoscopy (EGD, EUS, ERCP, esophageal dilation) °Vomiting of blood or coffee ground material  °New, significant abdominal pain  °New, significant chest pain or pain under the shoulder blades  °Painful or persistently difficult swallowing  °New shortness of breath  °Black, tarry-looking or red, bloody stools ° °FOLLOW UP:  °If any biopsies were taken you will be contacted by phone or by letter within the next 1-3 weeks. Call [phone]  if you have not heard about the biopsies in 3 weeks.  °Please also call with any specific questions about appointments or follow up tests. ° °

## 2017-11-18 NOTE — Anesthesia Procedure Notes (Signed)
Date/Time: 11/18/2017 11:14 AM Performed by: Cynda Familia, CRNA Pre-anesthesia Checklist: Patient identified, Emergency Drugs available, Suction available, Patient being monitored and Timeout performed Patient Re-evaluated:Patient Re-evaluated prior to induction Oxygen Delivery Method: Nasal cannula Placement Confirmation: positive ETCO2 and breath sounds checked- equal and bilateral Dental Injury: Teeth and Oropharynx as per pre-operative assessment  Comments: Ione O2 for sedation--bite block inserted by RN

## 2017-11-18 NOTE — Transfer of Care (Signed)
Immediate Anesthesia Transfer of Care Note  Patient: Kathryn Rodriguez  Procedure(s) Performed: UPPER ENDOSCOPIC ULTRASOUND (EUS) LINEAR (N/A ) FINE NEEDLE ASPIRATION (FNA) LINEAR  Patient Location: PACU  Anesthesia Type:MAC  Level of Consciousness: awake, alert  and patient cooperative  Airway & Oxygen Therapy: Patient Spontanous Breathing and Patient connected to nasal cannula oxygen  Post-op Assessment: Report given to RN and Post -op Vital signs reviewed and stable  Post vital signs: Reviewed and stable  Last Vitals:  Vitals Value Taken Time  BP    Temp    Pulse 57 11/18/2017 12:16 PM  Resp 19 11/18/2017 12:16 PM  SpO2 95 % 11/18/2017 12:16 PM    Last Pain:  Vitals:   11/18/17 0946  TempSrc: Oral  PainSc:          Complications: No apparent anesthesia complications

## 2017-11-18 NOTE — H&P (Signed)
GASTROENTEROLOGY OUTPATIENT PROCEDURE H&P NOTE   Primary Care Physician: Lemmie Evens, MD  HPI: Kathryn Rodriguez is a 42 y.o. female who presents for EUS in setting of abdominal pain and findings on CT of a BOP lesion and negative EGD workup (though had retained foodstuffs).  Past Medical History:  Diagnosis Date  . Abdominal pain, epigastric 10/19/2017  . Asthma   . COPD (chronic obstructive pulmonary disease) ([place])   . Migraines    Past Surgical History:  Procedure Laterality Date  . BIOPSY  10/26/2017   Procedure: BIOPSY;  Surgeon: [name], MD;  Location: [facility];  Service: Endoscopy;;  biopsy from GE junction  . c sections     x 3  . CHOLECYSTECTOMY    . complete hysterectomy    . ESOPHAGOGASTRODUODENOSCOPY N/A 10/26/2017   Procedure: ESOPHAGOGASTRODUODENOSCOPY (EGD);  Surgeon: [name], MD;  Location: [facility];  Service: Endoscopy;  Laterality: N/A;  12:25  . TONSILLECTOMY    . WISDOM TOOTH EXTRACTION     Current Facility-Administered Medications  Medication Dose Route Frequency Provider Last Rate Last Dose  . 0.9 %  sodium chloride infusion   Intravenous Continuous Mansouraty, Telford Nab., MD      . lactated ringers infusion   Intravenous Continuous Mansouraty, Telford Nab., MD 10 mL/hr at 11/18/17 1006     No Known Allergies History reviewed. No pertinent family history. Social History   Socioeconomic History  . Marital status: Divorced    Spouse name: Not on file  . Number of children: Not on file  . Years of education: Not on file  . Highest education level: Not on file  Occupational History  . Not on file  Social Needs  . Financial resource strain: Not on file  . Food insecurity:    Worry: Not on file    Inability: Not on file  . Transportation needs:    Medical: Not on file    Non-medical: Not on file  Tobacco Use  . Smoking status: Current Every Day Smoker  . Smokeless tobacco: Never Used  . Tobacco comment: 1 pack  day since age 34  Substance and Sexual Activity  . Alcohol use: Never    Frequency: Never  . Drug use: Not on file  . Sexual activity: Not on file  Lifestyle  . Physical activity:    Days per week: Not on file    Minutes per session: Not on file  . Stress: Not on file  Relationships  . Social connections:    Talks on phone: Not on file    Gets together: Not on file    Attends religious service: Not on file    Active member of club or organization: Not on file    Attends meetings of clubs or organizations: Not on file    Relationship status: Not on file  . Intimate partner violence:    Fear of current or ex partner: Not on file    Emotionally abused: Not on file    Physically abused: Not on file    Forced sexual activity: Not on file  Other Topics Concern  . Not on file  Social History Narrative  . Not on file    Physical Exam: Vital signs in last 24 hours: Temp:  [98.2 F (36.8 C)] 98.2 F (36.8 C) (09/11 0946) Pulse Rate:  [61] 61 (09/11 0946) Resp:  [16] 16 (09/11 0946) BP: (112)/(65) 112/65 (09/11 0946) SpO2:  [97 %] 97 % (  09/11 0946) Weight:  [106.6 kg] 106.6 kg (09/11 0934)   GEN: NAD, sister and friend at bedside EYE: Sclerae anicteric ENT: MMM CV: RR without R/Gs  RESP: CTAB posteriorly GI: Soft, TTP throughout abdomen 8-9/10 currently per patient report, rounded NEURO:  Alert & Oriented x 3  Lab Results: No results for input(s): WBC, HGB, HCT, PLT in the last 72 hours. BMET No results for input(s): NA, K, CL, CO2, GLUCOSE, BUN, CREATININE, CALCIUM in the last 72 hours. LFT No results for input(s): PROT, ALBUMIN, AST, ALT, ALKPHOS, BILITOT, BILIDIR, IBILI in the last 72 hours. PT/INR No results for input(s): LABPROT, INR in the last 72 hours.   Impression / Plan: This is a 42 y.o.female who presents for EUS.   The risks of EUS including bleeding, infection, aspiration pneumonia and intestinal perforation were discussed as was the possibility it  may not give a definitive diagnosis.  If a biopsy of the pancreas is done as part of the EUS, there is an additional risk of pancreatitis at the rate of about 1%.  It was explained that procedure related pancreatitis is typically mild, although can be severe and even life threatening, which is why we do not perform random pancreatic biopsies and only biopsy a lesion we feel is concerning enough to warrant the risk.  The risks and benefits of endoscopic evaluation were discussed with the patient; these include but are not limited to the risk of perforation, infection, bleeding, missed lesions, lack of diagnosis, severe illness requiring hospitalization, as well as anesthesia and sedation related illnesses.  The patient is agreeable to proceed.    Justice Britain, MD [facility] # [phone]

## 2017-11-19 ENCOUNTER — Encounter (HOSPITAL_COMMUNITY): Payer: Self-pay | Admitting: Gastroenterology

## 2017-11-19 ENCOUNTER — Telehealth: Payer: Self-pay | Admitting: Gastroenterology

## 2017-11-19 NOTE — Telephone Encounter (Signed)
Attempted call to patient about return of results this afternoon.  Left message to let her know that I will try to reach her before the weekend.  Justice Britain, MD [facility] # [phone]

## 2017-11-20 ENCOUNTER — Encounter: Payer: Self-pay | Admitting: Gastroenterology

## 2017-11-20 NOTE — Telephone Encounter (Signed)
I was able to reach the patient and talk with her about the results of the pathology from fine-needle biopsy. I have relayed these results to her referring GI provider as well as to her primary care doctor. She was concerned whether the discomfort she was experiencing was a result of her procedure.  I think it is unlikely based on her not having fevers or chills and it being a similar type of discomfort in the lower abdomen that she previously had however I did not discount or say that it was not impossible. Things are stable with her pain medication having been offered. I will defer further work-up to her primary gastroenterologist but would consider the role of a potential repeat/interval cross-sectional scan potentially a MRI to not only delineate the liver lesion which was presumed a hemangioma but also to see if any other changes have occurred within the abdominal cavity. All patient questions were answered, to the best of my ability, and the patient agrees to the aforementioned plan of action with follow-up as indicated.   Justice Britain, MD [facility] # [phone]

## 2017-11-24 ENCOUNTER — Encounter (INDEPENDENT_AMBULATORY_CARE_PROVIDER_SITE_OTHER): Payer: Self-pay | Admitting: *Deleted

## 2017-11-24 NOTE — Telephone Encounter (Signed)
When does patient need her MRI scheduled This encounter was created in error - please disregard.

## 2017-11-25 ENCOUNTER — Other Ambulatory Visit (INDEPENDENT_AMBULATORY_CARE_PROVIDER_SITE_OTHER): Payer: Self-pay | Admitting: Internal Medicine

## 2017-11-25 DIAGNOSIS — K8689 Other specified diseases of pancreas: Secondary | ICD-10-CM

## 2017-12-08 ENCOUNTER — Other Ambulatory Visit (INDEPENDENT_AMBULATORY_CARE_PROVIDER_SITE_OTHER): Payer: Self-pay | Admitting: Internal Medicine

## 2017-12-08 ENCOUNTER — Ambulatory Visit (HOSPITAL_COMMUNITY)
Admission: RE | Admit: 2017-12-08 | Discharge: 2017-12-08 | Disposition: A | Discharge status: Home / Self Care | Payer: BLUE CROSS/BLUE SHIELD | Source: Ambulatory Visit | Attending: Internal Medicine | Admitting: Internal Medicine

## 2017-12-08 DIAGNOSIS — D1803 Hemangioma of intra-abdominal structures: Secondary | ICD-10-CM | POA: Diagnosis not present

## 2017-12-08 DIAGNOSIS — K862 Cyst of pancreas: Secondary | ICD-10-CM | POA: Insufficient documentation

## 2017-12-08 DIAGNOSIS — K8689 Other specified diseases of pancreas: Secondary | ICD-10-CM

## 2017-12-08 DIAGNOSIS — Z9049 Acquired absence of other specified parts of digestive tract: Secondary | ICD-10-CM | POA: Insufficient documentation

## 2017-12-08 MED ORDER — GADOBUTROL 1 MMOL/ML IV SOLN
10.0000 mL | Freq: Once | INTRAVENOUS | Status: AC | PRN
  Administered 2017-12-08: 10 mL via INTRAVENOUS

## 2017-12-14 ENCOUNTER — Other Ambulatory Visit (INDEPENDENT_AMBULATORY_CARE_PROVIDER_SITE_OTHER): Payer: Self-pay | Admitting: Internal Medicine

## 2017-12-14 DIAGNOSIS — R1010 Upper abdominal pain, unspecified: Secondary | ICD-10-CM

## 2017-12-14 DIAGNOSIS — R1030 Lower abdominal pain, unspecified: Secondary | ICD-10-CM

## 2017-12-18 ENCOUNTER — Ambulatory Visit (HOSPITAL_COMMUNITY)
Admission: RE | Admit: 2017-12-18 | Discharge: 2017-12-18 | Disposition: A | Discharge status: Home / Self Care | Payer: BLUE CROSS/BLUE SHIELD | Source: Ambulatory Visit | Attending: Internal Medicine | Admitting: Internal Medicine

## 2017-12-18 DIAGNOSIS — R1030 Lower abdominal pain, unspecified: Secondary | ICD-10-CM | POA: Insufficient documentation

## 2017-12-18 DIAGNOSIS — R1010 Upper abdominal pain, unspecified: Secondary | ICD-10-CM | POA: Insufficient documentation

## 2018-01-07 ENCOUNTER — Encounter (INDEPENDENT_AMBULATORY_CARE_PROVIDER_SITE_OTHER): Payer: Self-pay | Admitting: Internal Medicine

## 2018-01-07 ENCOUNTER — Ambulatory Visit (INDEPENDENT_AMBULATORY_CARE_PROVIDER_SITE_OTHER): Payer: BLUE CROSS/BLUE SHIELD | Admitting: Internal Medicine

## 2018-01-07 VITALS — BP 122/68 | HR 74 | Temp 97.6°F | Resp 18 | Ht 65.0 in | Wt 230.3 lb

## 2018-01-07 DIAGNOSIS — R109 Unspecified abdominal pain: Secondary | ICD-10-CM

## 2018-01-07 DIAGNOSIS — K862 Cyst of pancreas: Secondary | ICD-10-CM | POA: Diagnosis not present

## 2018-01-07 MED ORDER — LINACLOTIDE 145 MCG PO CAPS: 145.0000 ug | ORAL_CAPSULE | Freq: Every day | ORAL | 5 refills | Status: DC

## 2018-01-07 MED ORDER — AMITRIPTYLINE HCL 25 MG PO TABS: 25.0000 mg | ORAL_TABLET | Freq: Every day | ORAL | 1 refills | Status: DC

## 2018-01-07 NOTE — Patient Instructions (Signed)
Please call office with progress report in 2 weeks.  

## 2018-01-07 NOTE — Progress Notes (Signed)
Presenting complaint;  Abdominal pain.  Database and subjective:  Patient is 42 year old Caucasian female who is here for scheduled visit accompanied by his Sister Darrick Penna.  She was initially seen on 10/19/2017. She presented with epigastric pain which started in July this year.  She did not respond to PPI therapy.  Since then she has undergone EGD, abdominal pelvic CT, EUS with FNA, MR and upper GI series with small bowel follow-through.  She states pain started about 4 months ago.  To begin with pain was in epigastric region radiating posteriorly.  She has some nausea but no vomiting.  Pain now has migrated into the mid and lower abdomen and at times radiates upwards.  She describes this pain to be extreme form of menstrual cramps.  She also complains of dyspareunia but she denies vaginal bleeding or discharge.  She also denies dysuria or hematuria but she has urinary urgency.  She has an appointment to see her gynecologist in 2 weeks.  She does not have a good appetite.  However she has not lost any weight.  She eats 1 meal a day.  She used to have bowel movement every 3 to 4 days but now she has 1 BM a week.  She is taking stool softener.  She denies melena or rectal bleeding.  She is taking pain medication for abdominal pain.  She take it 3-4 times a day. She is still grieving the loss of her grandmother who died in Jul 31, 2022 of this year.  She smokes cigarettes about a pack a day and drinks alcohol very occasionally. She denies fever chills or night sweats.   Current Medications: Outpatient Encounter Medications as of 01/07/2018  Medication Sig  . acetaminophen (TYLENOL) 500 MG tablet Take 1,000 mg by mouth daily as needed for moderate pain or headache.  . ALPRAZolam (XANAX) 1 MG tablet Take 1 mg by mouth 3 (three) times daily.  . Carboxymethylcellulose Sodium (EYE DROPS OP) Place 1 drop into both eyes daily as needed (irritation).  . cetirizine (ZYRTEC) 10 MG tablet Take 10 mg by mouth at  bedtime.   . docusate sodium (COLACE) 100 MG capsule Take 100 mg by mouth 2 (two) times daily.   Marland Kitchen HYDROcodone-acetaminophen (NORCO) 10-325 MG tablet Take 1 tablet by mouth 4 (four) times daily as needed for severe pain.   Marland Kitchen ibuprofen (ADVIL,MOTRIN) 800 MG tablet Take 800 mg by mouth as needed.   . ondansetron (ZOFRAN) 4 MG tablet Take 4 mg by mouth every 8 (eight) hours as needed for nausea or vomiting.  . pantoprazole (PROTONIX) 40 MG tablet Take 1 tablet (40 mg total) by mouth 2 (two) times daily before a meal.  . SEREVENT DISKUS 50 MCG/DOSE diskus inhaler Inhale 2 puffs into the lungs every morning.  . SUMAtriptan (IMITREX) 100 MG tablet Take 100 mg by mouth every 2 (two) hours as needed for migraine. May repeat in 2 hours if headache persists or recurs.  . topiramate (TOPAMAX) 100 MG tablet Take 100 mg by mouth 2 (two) times daily.  . VENTOLIN HFA 108 (90 Base) MCG/ACT inhaler Inhale 2 puffs into the lungs every 4 (four) hours as needed for wheezing.   . metoCLOPramide (REGLAN) 10 MG tablet Take 1 tablet (10 mg total) by mouth 3 (three) times daily before meals. (Patient not taking: Reported on 11/17/2017)  . [DISCONTINUED] Melatonin 5 MG TABS Take 5 mg by mouth at bedtime as needed (sleep).   No facility-administered encounter medications on file as of 01/07/2018.  Past Medical History:  Diagnosis Date  . Abdominal pain, epigastric 10/19/2017  . Asthma   . COPD (chronic obstructive pulmonary disease) ([place])   . Migraines    Past Surgical History:  Procedure Laterality Date  . BIOPSY  10/26/2017   Procedure: BIOPSY;  Surgeon: [name], MD;  Location: [facility];  Service: Endoscopy;;  biopsy from GE junction  . c sections     x 3  . CHOLECYSTECTOMY    . complete hysterectomy    . ESOPHAGOGASTRODUODENOSCOPY N/A 10/26/2017   Procedure: ESOPHAGOGASTRODUODENOSCOPY (EGD);  Surgeon: [name], MD;  Location: [facility];  Service: Endoscopy;  Laterality: N/A;  12:25  .  ESOPHAGOGASTRODUODENOSCOPY N/A 11/18/2017   Procedure: ESOPHAGOGASTRODUODENOSCOPY (EGD);  Surgeon: [name], MD;  Location: [facility];  Service: Gastroenterology;  Laterality: N/A;  . EUS N/A 11/18/2017   Procedure: UPPER ENDOSCOPIC ULTRASOUND (EUS) LINEAR;  Surgeon: [name], MD;  Location: [facility];  Service: Gastroenterology;  Laterality: N/A;  . FINE NEEDLE ASPIRATION  11/18/2017   Procedure: FINE NEEDLE ASPIRATION (FNA) LINEAR;  Surgeon: [name], MD;  Location: [facility];  Service: Gastroenterology;;  . TONSILLECTOMY    . WISDOM TOOTH EXTRACTION       Objective: Blood pressure 122/68, pulse 74, temperature 97.6 F (36.4 C), temperature source Oral, resp. rate 18, height 5\' 5"  (1.651 m), weight 230 lb 4.8 oz (104.5 kg). Patient is alert and in no acute distress. Conjunctiva is pink. Sclera is nonicteric Oropharyngeal mucosa is normal. No neck masses or thyromegaly noted. Cardiac exam with regular rhythm normal S1 and S2. No murmur or gallop noted. Lungs are clear to auscultation. Abdomen abdomen is full.  Bowel sounds are normal.  No bruit noted.  On palpation abdomen is soft.  She has mild generalized tenderness.  No guarding rebound organomegaly noted. No LE edema or clubbing noted.  Labs/studies Results: Following imaging studies were reviewed with the patient  Abdominopelvic CT from May 2013([facility]) which reveals lesion in left lobe of liver felt to be hemangioma.  There is contour abnormality to pancreas in the body but no definite pancreatic lesion noted.  EGD on 10/26/2017 revealed may be GE junction small sliding hiatal hernia and food debris in the stomach but no evidence of pyloric stenosis of peptic ulcer disease.  Biopsy from GE junction was negative for Barrett's.  Abdominopelvic CT on 10/26/2017 revealed 2.4 x 1.4 cm hypodense mass along the superior margin of mid body of pancreas as well as 2 cm hypodense mass in the  left lobe of the liver felt to be consistent with hemangioma.  Both celiac trunk and SMA are widely patent.  Pancreatic EUS with FNA An anechoic lesion suggestive of a cyst was identified in the pancreatic body. It is not in obvious communication with the pancreatic duct. The lesion measured 3.6 mm by 4.0 mm in maximal crosssectional diameter. There was a single compartment without septae. The outer wall of the lesion was thin. There was no associated mass. There was no internal debris within the fluid-filled cavity. An oval mass was identified in the peripancreatic region near the body of the pancreas. The lesion was heterogenous. It measured 16.7 mm by 11.5 mm in maximal cross-sectional diameter. The endosonographic borders were well-defined. There was sonographic evidence suggesting invasion into the splenic vein (manifested by abutment). An intact interface was seen between the mass and the celiac trunk and portal vein suggesting a lack of invasion. Fine needle biopsy was  performed. Color Doppler imaging was utilized prior to needle puncture to confirm a lack of significant vascular structures within the needle path. Five passes were made with the 25 gauge ultrasound biopsy needle using a transgastric approach. A visible core of tissue was obtained. 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  Normal upper GI with small bowel series on 12/18/2017   Assessment:  #1.  Abdominal pain of about 4 months duration.  Pain was in epigastric region to begin with but did not respond to PPI and now pain is mainly in mid and lower abdomen.  She also has constipation urinary urgency and dyspareunia.  She has undergone extensive evaluation and no source of her symptoms discovered cystic lesion and peripancreatic region appears to be an incidental finding.  I suspect she has irritable bowel syndrome but she could also  have chronic abdominal pain or migraine.  Her pain is not suggestive of abdominal angina but she must do her best to quit cigarette smoking.  She is to undergo gynecologic evaluation in the near future to complete her evaluation.   #2.  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  It was initially seen on CT of May 2013 and appears to be stable and no further work-up indicated.  #3.  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  I do not believe it has anything to do with patient's symptoms.  I would ask Dr. Thornton Papas to go over CT from May 2013.  If he feels cyst was there on that study no further work-up would be needed otherwise would consider follow-up CT in 1 year.   Plan:  Patient reassured. Patient advised to increase intake of fiber rich foods. Begin Linzess at 145 mcg p.o. every morning. Begin amitriptyline 25 mg p.o. Nightly. Patient will call with progress report in 2 weeks at which time we will consider increasing amitriptyline dose unless she is having side effects. Office visit in 1 year.

## 2018-01-22 ENCOUNTER — Encounter (INDEPENDENT_AMBULATORY_CARE_PROVIDER_SITE_OTHER): Payer: Self-pay

## 2018-02-02 ENCOUNTER — Encounter (INDEPENDENT_AMBULATORY_CARE_PROVIDER_SITE_OTHER): Payer: Self-pay | Admitting: Internal Medicine

## 2018-02-02 ENCOUNTER — Ambulatory Visit (INDEPENDENT_AMBULATORY_CARE_PROVIDER_SITE_OTHER): Payer: BLUE CROSS/BLUE SHIELD | Admitting: Internal Medicine

## 2018-02-02 VITALS — BP 118/72 | HR 68 | Temp 97.4°F | Resp 18 | Ht 65.0 in | Wt 230.3 lb

## 2018-02-02 DIAGNOSIS — R109 Unspecified abdominal pain: Secondary | ICD-10-CM | POA: Diagnosis not present

## 2018-02-02 DIAGNOSIS — K5909 Other constipation: Secondary | ICD-10-CM | POA: Diagnosis not present

## 2018-02-02 DIAGNOSIS — K862 Cyst of pancreas: Secondary | ICD-10-CM | POA: Diagnosis not present

## 2018-02-02 NOTE — Patient Instructions (Addendum)
Referral to [facility] to be arranged. Can use Dulcolax suppository on as-needed basis. Can double up on Linzess until bowels move them back to usual dose.

## 2018-02-02 NOTE — Progress Notes (Signed)
Presenting complaint;  Follow-up for abdominal pain.  Database and subjective:  Patient is 42 year old Caucasian female who is here for scheduled visit accompanied by his sister Darrick Penna.  She was initially seen on 10/19/2017. She presented with epigastric pain which started in July this year.  She did not respond to PPI therapy.  Since then she has undergone EGD, abdominal pelvic CT, EUS with FNA, MR and upper GI series with small bowel follow-through. She was last seen on 01/07/2018 and begun on amitriptyline at a dose of 25 mg daily.  Dose was subsequently increased to 50 mg daily.  She was also begun on Linzess for constipation.  Patient states she does not feel any better.  She has more or less constant pain.  Pain starts in epigastric and periumbilical region and radiates inferiorly into hypogastric region and posteriorly.  She describes this pain to be sharp.  She is taking pain medication 3-4 times a day with some relief.  She says she has not had any pain-free movement since her last visit.  She decided to return to work because she has to pay bills.  She remains with nausea and very poor appetite.  She has not had a bowel movement in 4 days.  Last bowel movement was on Friday.  She feels constipated.  She states Linzess is not working anymore.  She generally eats 1-2 small meals a day.  In spite of her poor appetite she has not lost any weight since her last visit. She is not having any side effects with current dose of amitriptyline. She is getting pain medication prescription from Dr. Karie Kirks.   Current Medications: Outpatient Encounter Medications as of 02/02/2018  Medication Sig  . acetaminophen (TYLENOL) 500 MG tablet Take 1,000 mg by mouth daily as needed for moderate pain or headache.  . ALPRAZolam (XANAX) 1 MG tablet Take 1 mg by mouth 3 (three) times daily.  Marland Kitchen amitriptyline (ELAVIL) 25 MG tablet Take 1 tablet (25 mg total) by mouth at bedtime.  . Carboxymethylcellulose Sodium  (EYE DROPS OP) Place 1 drop into both eyes daily as needed (irritation).  . cetirizine (ZYRTEC) 10 MG tablet Take 10 mg by mouth at bedtime.   Marland Kitchen HYDROcodone-acetaminophen (NORCO) 10-325 MG tablet Take 1 tablet by mouth 4 (four) times daily as needed for severe pain.   Marland Kitchen ibuprofen (ADVIL,MOTRIN) 800 MG tablet Take 800 mg by mouth as needed.   . linaclotide (LINZESS) 145 MCG CAPS capsule Take 1 capsule (145 mcg total) by mouth daily.  . ondansetron (ZOFRAN) 4 MG tablet Take 4 mg by mouth every 8 (eight) hours as needed for nausea or vomiting.  . pantoprazole (PROTONIX) 40 MG tablet Take 1 tablet (40 mg total) by mouth 2 (two) times daily before a meal.  . SEREVENT DISKUS 50 MCG/DOSE diskus inhaler Inhale 2 puffs into the lungs every morning.  . SUMAtriptan (IMITREX) 100 MG tablet Take 100 mg by mouth every 2 (two) hours as needed for migraine. May repeat in 2 hours if headache persists or recurs.  . topiramate (TOPAMAX) 100 MG tablet Take 100 mg by mouth 2 (two) times daily.  . VENTOLIN HFA 108 (90 Base) MCG/ACT inhaler Inhale 2 puffs into the lungs every 4 (four) hours as needed for wheezing.    No facility-administered encounter medications on file as of 02/02/2018.      Objective: Blood pressure 118/72, pulse 68, temperature (!) 97.4 F (36.3 C), temperature source Oral, resp. rate 18, height 5\' 5"  (1.651 m),  weight 230 lb 4.8 oz (104.5 kg). Patient is alert and in no acute distress. Conjunctiva is pink. Sclera is nonicteric Oropharyngeal mucosa is normal. No neck masses or thyromegaly noted. Cardiac exam with regular rhythm normal S1 and S2. No murmur or gallop noted. Lungs are clear to auscultation. Abdomen;  No LE edema or clubbing noted.    Assessment:  #1.  Abdominal pain.  Pain is mainly centered in upper and mid abdomen but radiates into hypogastric region.  She has undergone extensive work-up.  EGD was negative for peptic ulcer disease.  CT on 10/26/2017 suggested reveals 24 x  14 mm hypodense mass along superior margin of mid body of pancreas.  This lesion was further evaluated with EUS and MRCP and in my opinion appears to be an incidental finding possibly epidermoid cyst. Patient remains with constant pain requiring frequent use of pain medication. Therefore she needs to be further evaluated at tertiary center to make another attempt to sort out this pain.  She could have abdominal migraine or a IBS.  She has not responded to dicyclomine or low-dose amitriptyline.  #2.  Peripancreatic lesion.this lesion was discovered on CT of 19 2019 measuring 24 x 14 mm along superior aspect of mid body of pancreas. She underwent EUS by Dr. Rush Landmark on 11/18/2017 revealing 3.6 x 4 mm simple cyst in pancreatic body and oval mass near pancreatic body measuring 16.7 x 11.5 mm.  FNA revealed debris and squamous epithelium. Based on cytology this lesion may be an epidermoid cyst and I believe is an incidental finding.  #3.  Constipation secondary to pain medication.   Plan:  Patient advised to use Dulcolax suppository today and on as-needed basis. She can double up on Linzess to 290 mcg daily for a few days and then back to 145 mcg daily. Referral to GI specialist at [facility] for further evaluation of her abdominal pain. Office visit in 3 months.

## 2018-02-10 ENCOUNTER — Other Ambulatory Visit (INDEPENDENT_AMBULATORY_CARE_PROVIDER_SITE_OTHER): Payer: Self-pay | Admitting: Internal Medicine

## 2018-02-10 DIAGNOSIS — K219 Gastro-esophageal reflux disease without esophagitis: Secondary | ICD-10-CM

## 2018-02-10 DIAGNOSIS — R1013 Epigastric pain: Secondary | ICD-10-CM

## 2018-02-22 ENCOUNTER — Other Ambulatory Visit (INDEPENDENT_AMBULATORY_CARE_PROVIDER_SITE_OTHER): Payer: Self-pay | Admitting: Internal Medicine

## 2018-04-21 ENCOUNTER — Other Ambulatory Visit (INDEPENDENT_AMBULATORY_CARE_PROVIDER_SITE_OTHER): Payer: Self-pay | Admitting: *Deleted

## 2018-04-21 MED ORDER — NALDEMEDINE TOSYLATE 0.2 MG PO TABS: 0.2000 mg | ORAL_TABLET | Freq: Every day | ORAL | 5 refills | Status: DC

## 2018-04-29 ENCOUNTER — Other Ambulatory Visit (INDEPENDENT_AMBULATORY_CARE_PROVIDER_SITE_OTHER): Payer: Self-pay | Admitting: *Deleted

## 2018-04-29 NOTE — Telephone Encounter (Signed)
This Rx was called to [facility] on 04/21/2018. Patient has not picked it up yest, patient will be called and reminded that this is ready for her.

## 2018-05-04 ENCOUNTER — Encounter (INDEPENDENT_AMBULATORY_CARE_PROVIDER_SITE_OTHER): Payer: Self-pay | Admitting: Internal Medicine

## 2018-05-04 ENCOUNTER — Ambulatory Visit (INDEPENDENT_AMBULATORY_CARE_PROVIDER_SITE_OTHER): Payer: BLUE CROSS/BLUE SHIELD | Admitting: Internal Medicine

## 2018-05-04 ENCOUNTER — Encounter (INDEPENDENT_AMBULATORY_CARE_PROVIDER_SITE_OTHER): Payer: Self-pay

## 2018-07-25 ENCOUNTER — Other Ambulatory Visit (INDEPENDENT_AMBULATORY_CARE_PROVIDER_SITE_OTHER): Payer: Self-pay | Admitting: Internal Medicine

## 2018-07-25 DIAGNOSIS — R1013 Epigastric pain: Secondary | ICD-10-CM

## 2018-07-25 DIAGNOSIS — K219 Gastro-esophageal reflux disease without esophagitis: Secondary | ICD-10-CM

## 2018-10-13 ENCOUNTER — Other Ambulatory Visit: Payer: Self-pay

## 2018-10-13 ENCOUNTER — Other Ambulatory Visit (HOSPITAL_COMMUNITY): Payer: Self-pay | Admitting: Family Medicine

## 2018-10-13 ENCOUNTER — Ambulatory Visit (HOSPITAL_COMMUNITY)
Admission: RE | Admit: 2018-10-13 | Discharge: 2018-10-13 | Disposition: A | Discharge status: Home / Self Care | Payer: BLUE CROSS/BLUE SHIELD | Source: Ambulatory Visit | Attending: Family Medicine | Admitting: Family Medicine

## 2018-10-13 DIAGNOSIS — M79605 Pain in left leg: Secondary | ICD-10-CM | POA: Diagnosis present

## 2018-10-13 DIAGNOSIS — M25561 Pain in right knee: Secondary | ICD-10-CM | POA: Diagnosis present

## 2018-10-13 DIAGNOSIS — M25552 Pain in left hip: Secondary | ICD-10-CM | POA: Insufficient documentation

## 2018-10-14 ENCOUNTER — Other Ambulatory Visit: Payer: Self-pay

## 2018-11-04 IMAGING — MR MR ABDOMEN WO/W CM
9 of 21 series · 15 of 48 positions shown · IV contrast (gadavist)
Comparison: CT on 10/26/2017 and 07/25/2011

CLINICAL DATA: Indeterminate pancreatic lesion on recent CT. Upper
abdominal pain and nausea.

EXAM:
MRI ABDOMEN WITHOUT AND WITH CONTRAST (INCLUDING MRCP)
TECHNIQUE: Multiplanar multisequence MR imaging of the abdomen was performed
both before and after the administration of intravenous contrast.
Heavily T2-weighted images of the biliary and pancreatic ducts were
obtained, and three-dimensional MRCP images were rendered by post
processing.
CONTRAST:  10 mL Gadavist

[Series 3: T2 · coronal · 5.0mm · 1.21mm/px · 1 of 36 slices shown (1 of 3)]
[im 1/36]
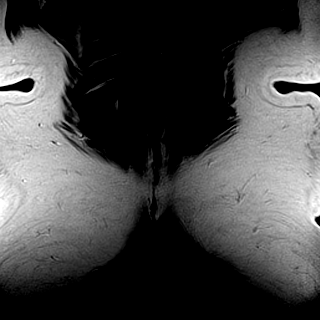

[Series 4: t2fs axial · axial · 5.0mm · 1.03mm/px · 1 of 40 slices shown]
[im 1/40]
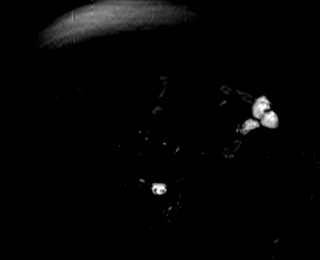

[Series 5: T2 · coronal · 1.0mm · 0.42mm/px · 3 of 104 slices shown (2 of 3)]
[im 1/104]
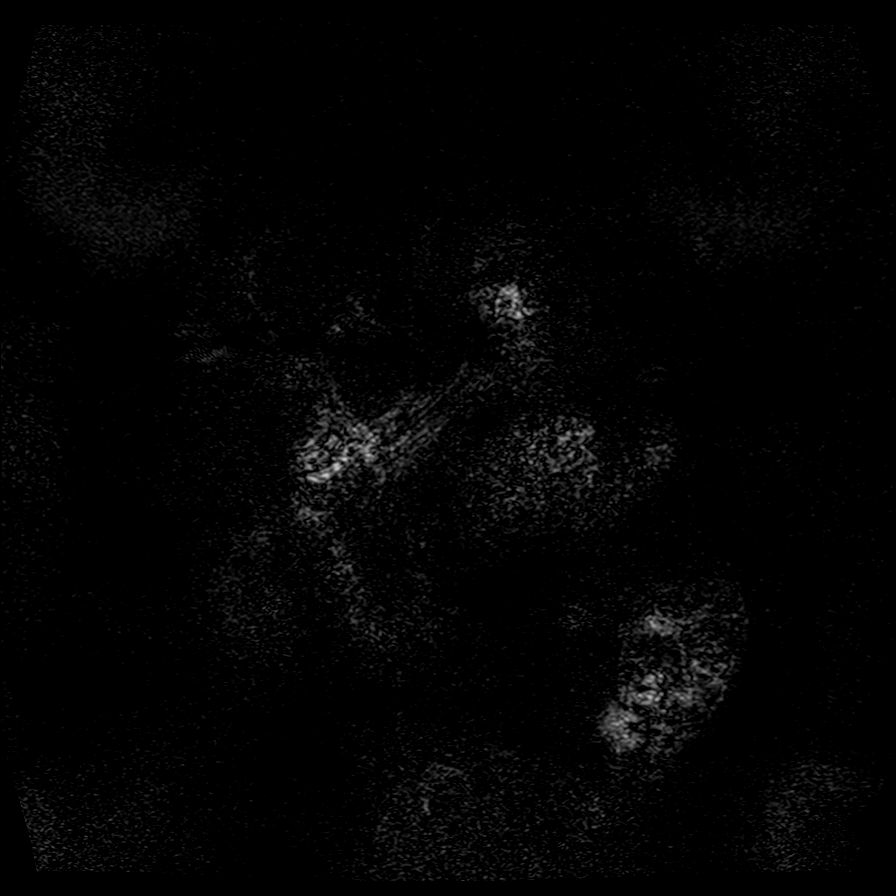
[im 52/104]
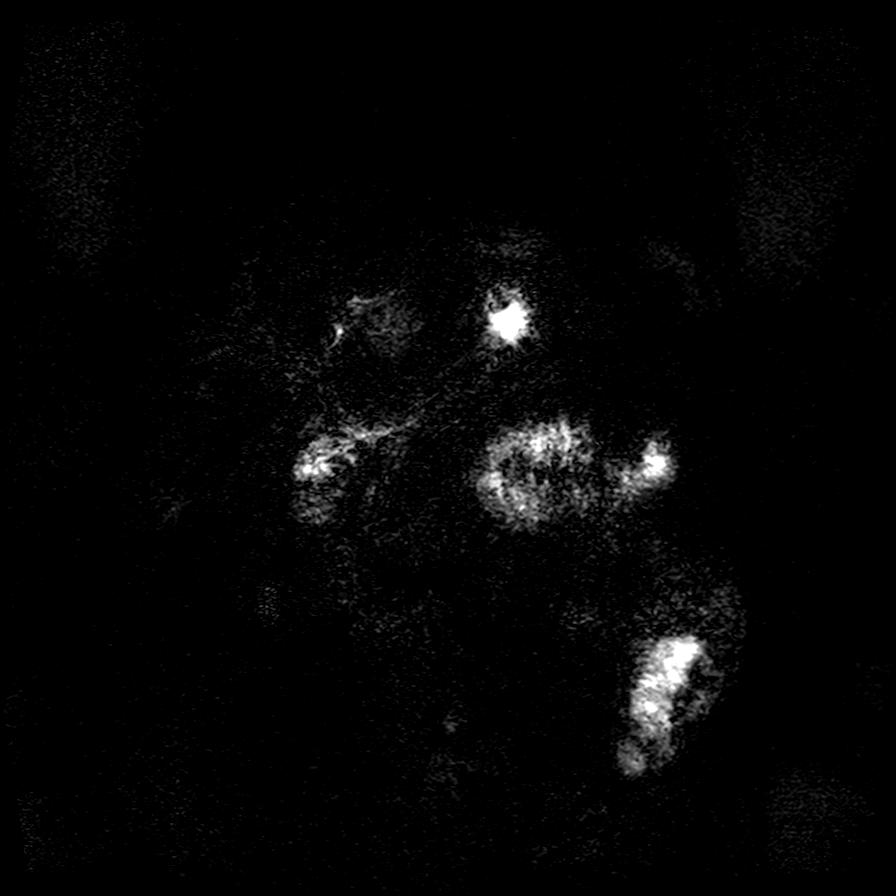
[im 104/104]
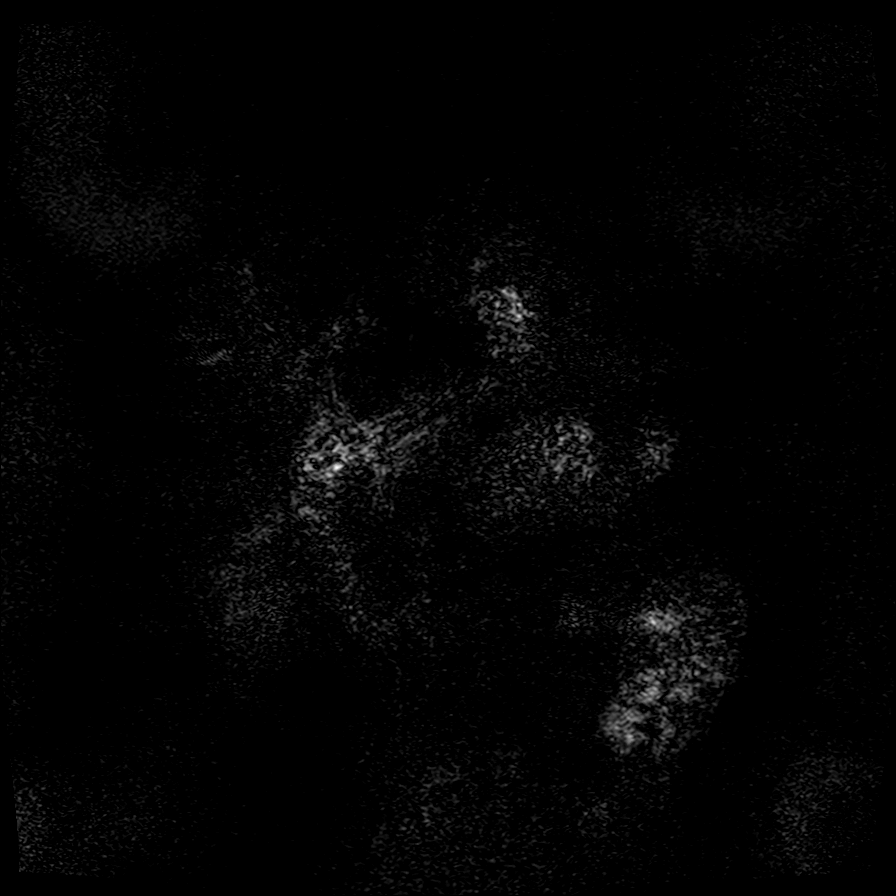

[Series 8: DWI · axial · 5.0mm · 0.99mm/px · z∈[-106,+128]mm · 3 of 80 slices shown (1 of 2)]
[im 1/80]
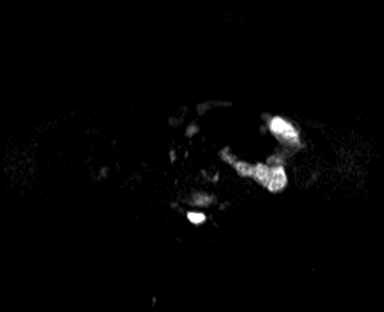
[im 40/80]
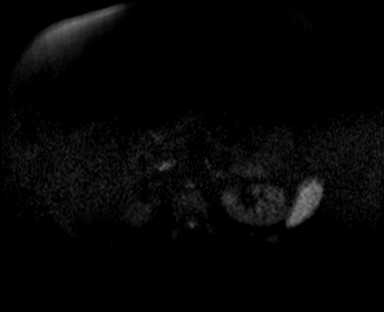
[im 80/80]
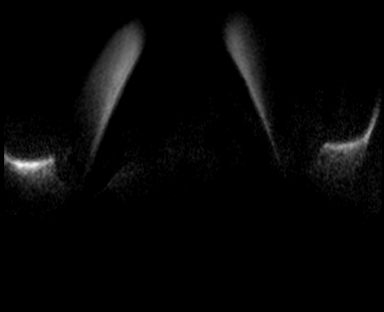

[Series 9: DWI · axial · 5.0mm · 0.99mm/px · 1 of 40 slices shown (2 of 2)]
[im 1/40]
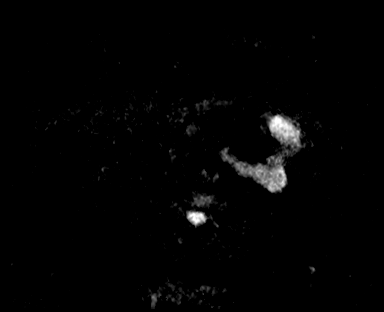

[Series 10: bSSFP · axial · 4.0mm · 0.68mm/px · z∈[-127,+149]mm · 2 of 70 slices shown]
[im 1/70]
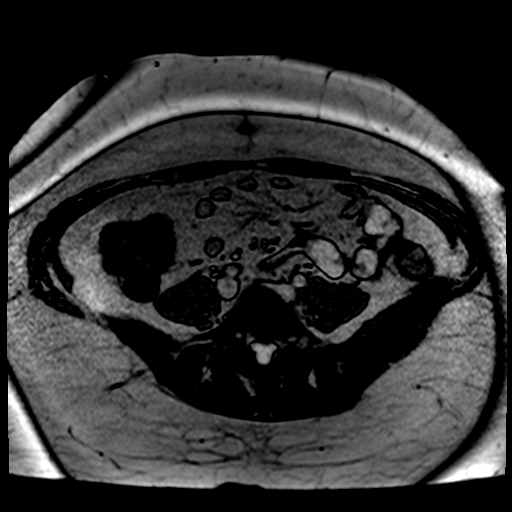
[im 70/70]
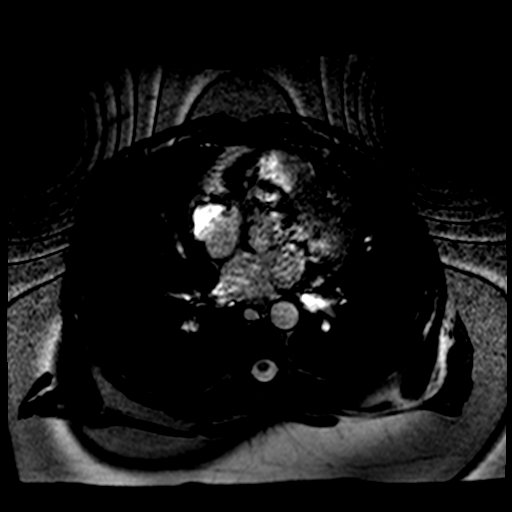

[Series 11: T2 · axial · 3.0mm · 0.59mm/px · 1 of 40 slices shown (3 of 3)]
[im 1/40]
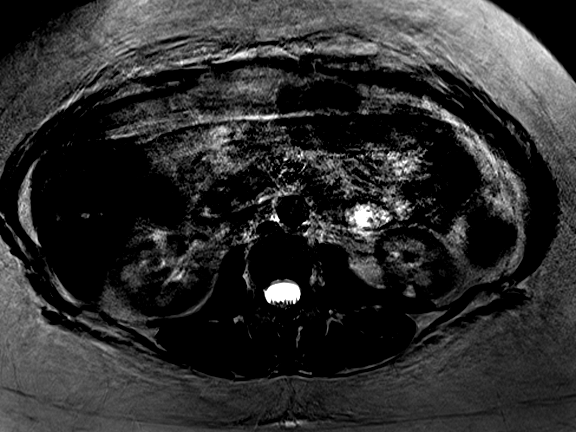

[Series 12: T1 · axial · 4.0mm · 0.59mm/px · z∈[-103,+149]mm · 2 of 64 slices shown (1 of 2)]
[im 1/64]
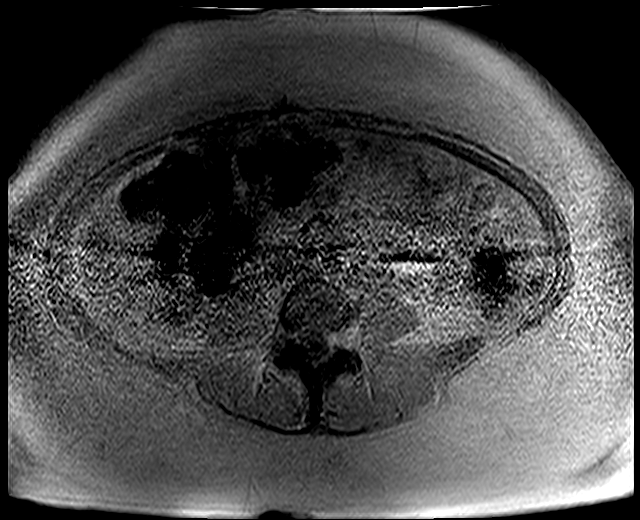
[im 64/64]
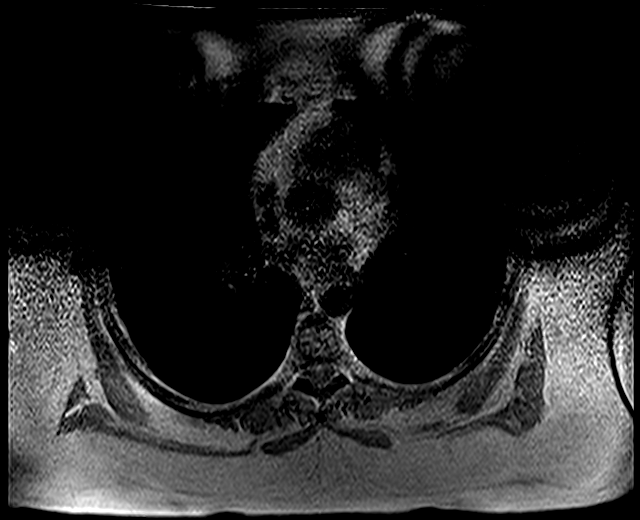

[Series 13: T1 · axial · 4.0mm · 0.59mm/px · 1 of 64 slices shown (2 of 2)]
[im 1/64]
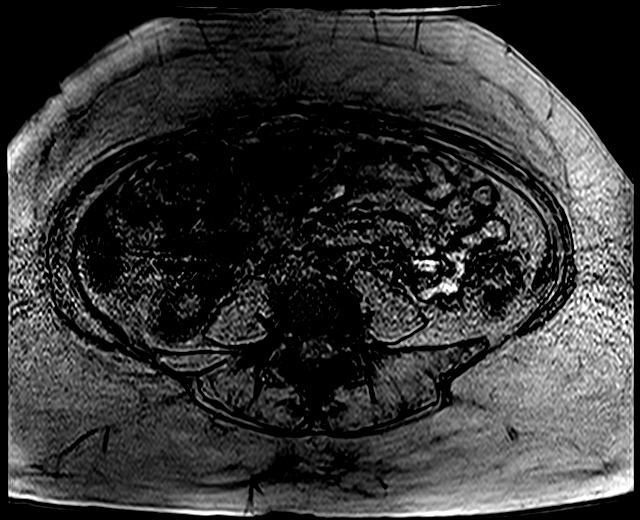

[15 of 48 positions shown; findings below may reference images not displayed]

FINDINGS: Lower chest: No acute findings.

Hepatobiliary: A 3.0 cm benign hemangioma in segment [DATE] of the left
lobe remains stable. No other liver masses are identified.

Prior cholecystectomy noted. Although degraded by motion artifact,
MRCP images show no definite evidence of choledocholithiasis. Common
bile duct measures 5 mm, which is within normal limits.

Pancreas: A mildly complicated cystic lesion is seen along the
superior margin of the pancreatic body which measures 3.0 x 2.1 cm
on image 17/22. This lesion shows no evidence of internal contrast
enhancement, and differential diagnosis includes cystic pancreatic
neoplasm and pancreatic pseudocyst. No other pancreatic lesions
identified. Poor visualization of pancreatic duct due to motion
artifact, however there is no evidence of pancreatic ductal
dilatation.

Spleen:  Within normal limits in size and appearance.

Adrenals/Urinary Tract: Tiny benign right renal cyst noted. No
masses identified. No evidence of hydronephrosis.

Stomach/Bowel: Visualized portion unremarkable.

Vascular/Lymphatic: No pathologically enlarged lymph nodes
identified. No abdominal aortic aneurysm.

Other:  None.

Musculoskeletal: No suspicious bone lesions identified. Benign
hemangioma in the lower thoracic spine incidentally noted.
IMPRESSION: 3 cm cystic lesion along the superior margin of the pancreatic body.
This is suspicious for cystic pancreatic neoplasm, but has no high
risk features. Consider continued follow-up by MRI in 6 months or
EUS/FNA. This recommendation follows ACR consensus guidelines:
Management of Incidental Pancreatic Cysts: A White Paper of the ACR
Incidental Findings Committee. [HOSPITAL] 8529;[DATE].

Stable benign hemangioma in the left hepatic lobe.

Prior cholecystectomy.  No evidence of biliary ductal dilatation.

## 2019-02-09 ENCOUNTER — Ambulatory Visit: Payer: BC Managed Care – PPO | Admitting: Neurology

## 2019-02-09 ENCOUNTER — Other Ambulatory Visit: Payer: Self-pay

## 2019-02-09 ENCOUNTER — Encounter: Payer: Self-pay | Admitting: Neurology

## 2019-02-09 VITALS — BP 105/75 | HR 61 | Temp 96.8°F | Ht 67.0 in | Wt 229.5 lb

## 2019-02-09 DIAGNOSIS — R0683 Snoring: Secondary | ICD-10-CM | POA: Insufficient documentation

## 2019-02-09 DIAGNOSIS — G43711 Chronic migraine without aura, intractable, with status migrainosus: Secondary | ICD-10-CM | POA: Insufficient documentation

## 2019-02-09 DIAGNOSIS — Z9189 Other specified personal risk factors, not elsewhere classified: Secondary | ICD-10-CM | POA: Diagnosis not present

## 2019-02-09 DIAGNOSIS — F11988 Opioid use, unspecified with other opioid-induced disorder: Secondary | ICD-10-CM | POA: Insufficient documentation

## 2019-02-09 DIAGNOSIS — G4723 Circadian rhythm sleep disorder, irregular sleep wake type: Secondary | ICD-10-CM | POA: Insufficient documentation

## 2019-02-09 DIAGNOSIS — G4710 Hypersomnia, unspecified: Secondary | ICD-10-CM

## 2019-02-09 DIAGNOSIS — E6609 Other obesity due to excess calories: Secondary | ICD-10-CM

## 2019-02-09 DIAGNOSIS — Z6839 Body mass index (BMI) 39.0-39.9, adult: Secondary | ICD-10-CM

## 2019-02-09 NOTE — Patient Instructions (Signed)
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
## 2019-02-25 ENCOUNTER — Other Ambulatory Visit (INDEPENDENT_AMBULATORY_CARE_PROVIDER_SITE_OTHER): Payer: Self-pay | Admitting: Internal Medicine

## 2019-02-25 DIAGNOSIS — K219 Gastro-esophageal reflux disease without esophagitis: Secondary | ICD-10-CM

## 2019-02-25 DIAGNOSIS — R1013 Epigastric pain: Secondary | ICD-10-CM

## 2019-03-08 ENCOUNTER — Ambulatory Visit (INDEPENDENT_AMBULATORY_CARE_PROVIDER_SITE_OTHER): Payer: BC Managed Care – PPO | Admitting: Neurology

## 2019-03-08 DIAGNOSIS — F11988 Opioid use, unspecified with other opioid-induced disorder: Secondary | ICD-10-CM

## 2019-03-08 DIAGNOSIS — E6609 Other obesity due to excess calories: Secondary | ICD-10-CM

## 2019-03-08 DIAGNOSIS — G4723 Circadian rhythm sleep disorder, irregular sleep wake type: Secondary | ICD-10-CM

## 2019-03-08 DIAGNOSIS — Z6839 Body mass index (BMI) 39.0-39.9, adult: Secondary | ICD-10-CM

## 2019-03-08 DIAGNOSIS — G4710 Hypersomnia, unspecified: Secondary | ICD-10-CM | POA: Diagnosis not present

## 2019-03-08 DIAGNOSIS — R0683 Snoring: Secondary | ICD-10-CM

## 2019-03-08 DIAGNOSIS — G43711 Chronic migraine without aura, intractable, with status migrainosus: Secondary | ICD-10-CM

## 2019-03-08 DIAGNOSIS — Z9189 Other specified personal risk factors, not elsewhere classified: Secondary | ICD-10-CM

## 2019-03-14 NOTE — Addendum Note (Signed)
Addended by: Larey Seat on: 03/14/2019 12:46 PM   Modules accepted: Orders

## 2019-03-14 NOTE — Procedures (Signed)
PATIENT'S NAME:  Kathryn Rodriguez, Kathryn Rodriguez DOB:         MR#:    [id number]     DATE OF RECORDING: 03/08/2019 REFERRING M.D.:  Lemmie Evens, MD Study Performed:   Baseline Polysomnogram HISTORY:  Kathryn Rodriguez is a 44 year- old Caucasian female patient seen face-to-face upon referral on 02/09/2019.  Chief concern according to patient: Paper referral from Carlis Abbott, NP for excessive daytime drowsiness. No prior sleep study.  She works nights:  Saturday, Sunday and Monday.  She will sleep most of the days she has off. Only awake about 9 hours total the 4 days she has off. Having trouble staying awake while driving. She snores. She has pelvic pain. She has "chronic Migraines "   Kathryn Rodriguez is a left-handed [place] or Caucasian female with a possible sleep disorder.  She has a past medical history of Abdominal pain, epigastric (10/19/2017), Anxiety, Asthma, COPD (chronic obstructive pulmonary disease) ([place]), Depression, High cholesterol, Migraines, and Pelvic floor dysfunction.  The patient is morbidly obese 36 kg/m2.  She was in [place] told she got COPD from smoking since age 63 and living in a dust prone area of [place] until age 74, then moved to [place] age 44, and to [place] age 53.    The patient endorsed the Epworth Sleepiness Scale at 21/24 points.   The patient's weight 229 pounds with a height of 67 (inches), resulting in a BMI of 36. kg/m2. The patient's neck circumference measured 15 inches.  CURRENT MEDICATIONS: Acetaminophen, Alprazolam, Atorvastatin, Carboxymethylcellulose, Cetirizine, Cyclobenzaprine, Duloxetine, Hydrocodone, Ibuprofen, Ondansetron, Pantoprazole and Sumatriptan.   PROCEDURE:  This is a multichannel digital polysomnogram utilizing the Somnostar 11.2 system.  Electrodes and sensors were applied and monitored per AASM Specifications.   EEG, EOG, Chin and Limb EMG, were sampled at 200 Hz.  ECG, Snore and Nasal Pressure, Thermal Airflow, Respiratory Effort, CPAP Flow  and Pressure, Oximetry was sampled at 50 Hz. Digital video and audio were recorded.      BASELINE STUDY: Lights Out was at 09:40 and Lights On at 15:46.  Total recording time (TRT) was 366.5 minutes, with a total sleep time (TST) of 343.5 minutes.   The patient's sleep latency was 12 minutes.  REM latency was 0 minutes.  The sleep efficiency was 93.7 %.     SLEEP ARCHITECTURE: WASO (Wake after sleep onset) was 11 minutes.  There were 9.5 minutes in Stage N1, 205 minutes Stage N2, 129 minutes Stage N3 and 0 minutes in Stage REM.  The percentage of Stage N1 was 2.8%, Stage N2 was 59.7%, Stage N3 was 37.6% and Stage R (REM sleep) was 0%.    RESPIRATORY ANALYSIS:  There were a total of 4 respiratory events:  0 apneas and 4 hypopneas with a hypopnea index of .7 /hour. The patient also had 0 respiratory event related arousals (RERAs).      The total APNEA/HYPOPNEA INDEX (AHI) was 0.7/hour and the total RESPIRATORY DISTURBANCE INDEX was  0.7 /hour.  0 events occurred in REM sleep and 8 events in NREM. The REM AHI was  0.0 /hour, versus a non-REM AHI of  0.7/h. The patient spent 236.5 minutes of total sleep time in the supine position and 107 minutes in non-supine. The supine AHI was 1.0/h versus a non-supine AHI of 0.0/h.  OXYGEN SATURATION & C02:  The Wake baseline 02 saturation was 88%, with the lowest being 82%. Time spent below 89% saturation equaled 30 minutes.   The arousals were noted  as: 24 were spontaneous, 0 were associated with PLMs, 4 were associated with respiratory events. The patient had a total of 0 Periodic Limb Movements.   Audio and video analysis did not show any abnormal or unusual movements, behaviors, phonations or vocalizations. The patient took bathroom breaks. Snoring was noted. EKG was in keeping with normal sinus rhythm (NSR).  IMPRESSION:  1. No evidence of any sleep apnea, only snoring. Mild hypoxemia time of 30 minutes.  2. Snoring cannot be the cause of this degree of  daytime sleepiness   RECOMMENDATIONS: Hypersomnia work up will include HLA narcolepsy test, and if positive, preparation for MSLT.  This means weaning off: Duloxetine, Flexeril, hydrocodone and alprazolam, ondansetron would not allow a meaning full, valid test. Shift worker have access to modafinil for daytime alertness         I certify that I have reviewed the entire raw data recording prior to the issuance of this report in accordance with the Standards of Accreditation of the [place] of Sleep Medicine (AASM)    Larey Seat, MD Diplomat, American Board of Psychiatry and Neurology  Diplomat, American Board of Sleep Medicine Market researcher, [facility] [employer]

## 2019-03-16 ENCOUNTER — Encounter: Payer: Self-pay | Admitting: Neurology

## 2019-05-30 ENCOUNTER — Other Ambulatory Visit: Payer: Self-pay | Admitting: Urology

## 2019-06-21 ENCOUNTER — Other Ambulatory Visit: Payer: Self-pay | Admitting: Nurse Practitioner

## 2019-06-21 DIAGNOSIS — R9389 Abnormal findings on diagnostic imaging of other specified body structures: Secondary | ICD-10-CM

## 2019-07-15 ENCOUNTER — Encounter (HOSPITAL_COMMUNITY): Payer: Self-pay

## 2019-07-15 ENCOUNTER — Ambulatory Visit (HOSPITAL_COMMUNITY): Payer: BC Managed Care – PPO

## 2020-02-24 ENCOUNTER — Other Ambulatory Visit (HOSPITAL_COMMUNITY): Payer: Self-pay | Admitting: Family Medicine

## 2020-02-24 DIAGNOSIS — Z87891 Personal history of nicotine dependence: Secondary | ICD-10-CM

## 2020-04-25 ENCOUNTER — Other Ambulatory Visit (HOSPITAL_COMMUNITY): Payer: Self-pay | Admitting: Nurse Practitioner

## 2020-04-25 DIAGNOSIS — R053 Chronic cough: Secondary | ICD-10-CM

## 2021-11-06 ENCOUNTER — Encounter (INDEPENDENT_AMBULATORY_CARE_PROVIDER_SITE_OTHER): Payer: Self-pay | Admitting: *Deleted

## 2021-12-04 ENCOUNTER — Other Ambulatory Visit (INDEPENDENT_AMBULATORY_CARE_PROVIDER_SITE_OTHER): Payer: Self-pay

## 2021-12-04 ENCOUNTER — Encounter (INDEPENDENT_AMBULATORY_CARE_PROVIDER_SITE_OTHER): Payer: Self-pay | Admitting: *Deleted

## 2021-12-04 DIAGNOSIS — Z1211 Encounter for screening for malignant neoplasm of colon: Secondary | ICD-10-CM

## 2021-12-16 ENCOUNTER — Telehealth (INDEPENDENT_AMBULATORY_CARE_PROVIDER_SITE_OTHER): Payer: Self-pay

## 2021-12-16 ENCOUNTER — Encounter (INDEPENDENT_AMBULATORY_CARE_PROVIDER_SITE_OTHER): Payer: Self-pay

## 2021-12-16 MED ORDER — PEG 3350-KCL-NA BICARB-NACL 420 G PO SOLR: 4000.0000 mL | ORAL | 0 refills | Status: DC

## 2021-12-16 NOTE — Telephone Encounter (Signed)
Anntoinette Haefele Ann Arleigh Dicola, CMA  ?

## 2021-12-16 NOTE — Telephone Encounter (Signed)
Referring MD/PCP: Barbette Or  Procedure: Tcs  Reason/Indication:  Screening   Has patient had this procedure before?  No   If so, when, by whom and where?    Is there a family history of colon cancer?  Yes   Who?  What age when diagnosed? Mother and both maternal grandparents   Is patient diabetic? If yes, Type 1 or Type 2   No       Does patient have prosthetic heart valve or mechanical valve?  No   Do you have a pacemaker/defibrillator?  No   Has patient ever had endocarditis/atrial fibrillation? No   Does patient use oxygen? No   Has patient had joint replacement within last 12 months?  No   Is patient constipated or do they take laxatives? yes  Does patient have a history of alcohol/drug use?  No   Have you had a stroke/heart attack last 6 mths? No   Do you take medicine for weight loss?  No   For female patients,: have you had a hysterectomy yes                      are you post menopausal maybe                      do you still have your menstrual cycle no   Is patient on blood thinner such as Coumadin, Plavix and/or Aspirin? No   Medications: hydroco/Apap 10/325 mg x's 5 prn, Vleafaxine 75 mg bid, Atorvastatin 10 mg daily, Cetirizine Hcl 10 mg daily, Nurtec Odt 75 mg, Iburprofen 800 mg tid prn, Docusate Sodium 100 mg softget bid, Cyclobenzaprine 10 mg 3 x's daily, Omeprazole Dr 40 mg Cap daily, Alprazolam 1 mg tid, Vit D3 1,250 mcg once a week Vit B 12 1000 mcg 2 tabs daily, topiramate 200 mg bid, albuterol HFA 90 mcg inhaler 2 puffs every 4 hrs prn   Allergies: Cefuroxime, PCN   Medication Adjustment per Dr Jenetta Downer None   Procedure date & time: 01/15/22 at 8:05

## 2022-01-13 ENCOUNTER — Telehealth (INDEPENDENT_AMBULATORY_CARE_PROVIDER_SITE_OTHER): Payer: Self-pay | Admitting: *Deleted

## 2022-01-13 NOTE — Telephone Encounter (Signed)
Pt called in confused with her instructions. She asked if I could send new instructions to her mychart. Will do so.

## 2022-01-15 ENCOUNTER — Ambulatory Visit (HOSPITAL_COMMUNITY): Payer: BC Managed Care – PPO | Admitting: Anesthesiology

## 2022-01-15 ENCOUNTER — Encounter (HOSPITAL_COMMUNITY)
Admission: RE | Disposition: A | Discharge status: Home / Self Care | Payer: Self-pay | Source: Home / Self Care | Attending: Gastroenterology

## 2022-01-15 ENCOUNTER — Other Ambulatory Visit: Payer: Self-pay

## 2022-01-15 ENCOUNTER — Encounter (HOSPITAL_COMMUNITY): Payer: Self-pay | Admitting: Gastroenterology

## 2022-01-15 ENCOUNTER — Encounter (INDEPENDENT_AMBULATORY_CARE_PROVIDER_SITE_OTHER): Payer: Self-pay | Admitting: *Deleted

## 2022-01-15 ENCOUNTER — Ambulatory Visit (HOSPITAL_COMMUNITY)
Admission: RE | Admit: 2022-01-15 | Discharge: 2022-01-15 | Disposition: A | Discharge status: Home / Self Care | Payer: BC Managed Care – PPO | Attending: Gastroenterology | Admitting: Gastroenterology

## 2022-01-15 DIAGNOSIS — D122 Benign neoplasm of ascending colon: Secondary | ICD-10-CM | POA: Diagnosis not present

## 2022-01-15 DIAGNOSIS — F1120 Opioid dependence, uncomplicated: Secondary | ICD-10-CM | POA: Insufficient documentation

## 2022-01-15 DIAGNOSIS — Z1211 Encounter for screening for malignant neoplasm of colon: Secondary | ICD-10-CM | POA: Diagnosis present

## 2022-01-15 DIAGNOSIS — J449 Chronic obstructive pulmonary disease, unspecified: Secondary | ICD-10-CM | POA: Insufficient documentation

## 2022-01-15 DIAGNOSIS — F418 Other specified anxiety disorders: Secondary | ICD-10-CM | POA: Diagnosis not present

## 2022-01-15 DIAGNOSIS — M1990 Unspecified osteoarthritis, unspecified site: Secondary | ICD-10-CM | POA: Insufficient documentation

## 2022-01-15 DIAGNOSIS — G709 Myoneural disorder, unspecified: Secondary | ICD-10-CM | POA: Insufficient documentation

## 2022-01-15 DIAGNOSIS — E785 Hyperlipidemia, unspecified: Secondary | ICD-10-CM | POA: Insufficient documentation

## 2022-01-15 DIAGNOSIS — Z800 Family history of malignant neoplasm of digestive organs: Secondary | ICD-10-CM | POA: Insufficient documentation

## 2022-01-15 DIAGNOSIS — K219 Gastro-esophageal reflux disease without esophagitis: Secondary | ICD-10-CM | POA: Insufficient documentation

## 2022-01-15 DIAGNOSIS — G43909 Migraine, unspecified, not intractable, without status migrainosus: Secondary | ICD-10-CM | POA: Insufficient documentation

## 2022-01-15 DIAGNOSIS — Z79899 Other long term (current) drug therapy: Secondary | ICD-10-CM | POA: Insufficient documentation

## 2022-01-15 DIAGNOSIS — K635 Polyp of colon: Secondary | ICD-10-CM | POA: Diagnosis not present

## 2022-01-15 DIAGNOSIS — F17210 Nicotine dependence, cigarettes, uncomplicated: Secondary | ICD-10-CM | POA: Insufficient documentation

## 2022-01-15 HISTORY — PX: COLONOSCOPY WITH PROPOFOL: SHX5780

## 2022-01-15 HISTORY — PX: POLYPECTOMY: SHX5525

## 2022-01-15 LAB — HM COLONOSCOPY

## 2022-01-15 SURGERY — COLONOSCOPY WITH PROPOFOL
Anesthesia: General

## 2022-01-15 MED ORDER — PROPOFOL 500 MG/50ML IV EMUL
INTRAVENOUS | Status: DC | PRN
  Administered 2022-01-15: 150 ug/kg/min via INTRAVENOUS

## 2022-01-15 MED ORDER — LACTATED RINGERS IV SOLN: INTRAVENOUS | Status: DC

## 2022-01-15 MED ORDER — PROPOFOL 10 MG/ML IV BOLUS
INTRAVENOUS | Status: DC | PRN
  Administered 2022-01-15 (×2): 20 mg via INTRAVENOUS
  Administered 2022-01-15: 40 mg via INTRAVENOUS
  Administered 2022-01-15: 20 mg via INTRAVENOUS

## 2022-01-15 MED ORDER — LIDOCAINE HCL (CARDIAC) PF 100 MG/5ML IV SOSY
PREFILLED_SYRINGE | INTRAVENOUS | Status: DC | PRN
  Administered 2022-01-15: 50 mg via INTRAVENOUS

## 2022-01-15 NOTE — Op Note (Signed)
[facility] Patient Name: Kathryn Rodriguez Procedure Date: 01/15/2022 8:13 AM MRN: [id number] Date of Birth: 17-Jan-1976 Attending MD: Maylon Peppers , , [id number] CSN: [id number] Age: 46 Admit Type: Outpatient Procedure:                Colonoscopy Indications:              Screening for colorectal malignant neoplasm Providers:                Maylon Peppers, Caprice Kluver, Aram Candela Referring MD:              Medicines:                Monitored Anesthesia Care Complications:            No immediate complications. Estimated Blood Loss:     Estimated blood loss: none. Procedure:                Pre-Anesthesia Assessment:                           - Prior to the procedure, a History and Physical                            was performed, and patient medications, allergies                            and sensitivities were reviewed. The patient's                            tolerance of previous anesthesia was reviewed.                           - The risks and benefits of the procedure and the                            sedation options and risks were discussed with the                            patient. All questions were answered and informed                            consent was obtained.                           - ASA Grade Assessment: II - A patient with mild                            systemic disease.                           After obtaining informed consent, the colonoscope                            was passed under direct vision. Throughout the                            procedure, the patient's blood pressure,  pulse, and                            oxygen saturations were monitored continuously. The                            [id number]-HQ190L ([id number]) scope was introduced through                            the anus and advanced to the the cecum, identified                            by appendiceal orifice and ileocecal valve. The                            colonoscopy was  performed without difficulty. The                            patient tolerated the procedure well. The quality                            of the bowel preparation was excellent. Scope In: 8:24:44 AM Scope Out: 8:50:24 AM Scope Withdrawal Time: 0 hours 16 minutes 33 seconds  Total Procedure Duration: 0 hours 25 minutes 40 seconds  Findings:      The perianal and digital rectal examinations were normal.      Two sessile polyps were found in the ascending colon. The polyps were 3       to 6 mm in size. These polyps were removed with a cold snare. Resection       and retrieval were complete.      A 3 mm polyp was found in the descending colon. The polyp was sessile.       The polyp was removed with a cold snare. Resection and retrieval were       complete.      The retroflexed view of the distal rectum and anal verge was normal and       showed no anal or rectal abnormalities. Impression:               - Two 3 to 6 mm polyps in the ascending colon,                            removed with a cold snare. Resected and retrieved.                           - One 3 mm polyp in the descending colon, removed                            with a cold snare. Resected and retrieved.                           - The distal rectum and anal verge are normal on                            retroflexion view. Moderate  Sedation:      Per Anesthesia Care Recommendation:           - Discharge patient to home (ambulatory).                           - Resume previous diet.                           - Await pathology results.                           - Repeat colonoscopy for surveillance based on                            pathology results. Procedure Code(s):        --- Professional ---                           [phone], Colonoscopy, flexible; with removal of                            tumor(s), polyp(s), or other lesion(s) by snare                            technique Diagnosis Code(s):        --- Professional  ---                           Z12.11, Encounter for screening for malignant                            neoplasm of colon                           D12.2, Benign neoplasm of ascending colon                           D12.4, Benign neoplasm of descending colon CPT copyright 2022 [employer]. All rights reserved. The codes documented in this report are preliminary and upon coder review may  be revised to meet current compliance requirements. Maylon Peppers, MD Maylon Peppers,  01/15/2022 8:57:28 AM This report has been signed electronically. Number of Addenda: 0

## 2022-01-15 NOTE — Anesthesia Postprocedure Evaluation (Signed)
Anesthesia Post Note  Patient: Kathryn Rodriguez  Procedure(s) Performed: COLONOSCOPY WITH PROPOFOL POLYPECTOMY  Patient location during evaluation: Phase II Anesthesia Type: General Level of consciousness: awake and alert and oriented Pain management: pain level controlled Vital Signs Assessment: post-procedure vital signs reviewed and stable Respiratory status: spontaneous breathing, nonlabored ventilation and respiratory function stable Cardiovascular status: blood pressure returned to baseline and stable Postop Assessment: no apparent nausea or vomiting Anesthetic complications: no  No notable events documented.   Last Vitals:  Vitals:   01/15/22 0707 01/15/22 0853  BP: 117/74 (!) 99/59  Pulse: 67 62  Resp: 16 20  Temp: 36.8 C (!) 36.4 C  SpO2: 98% 92%    Last Pain:  Vitals:   01/15/22 0853  TempSrc: Axillary  PainSc: 7                  Jahir Halt C Jobe Mutch

## 2022-01-15 NOTE — Discharge Instructions (Signed)
You are being discharged to home.  Resume your previous diet.  We are waiting for your pathology results.  Your physician has recommended a repeat colonoscopy for surveillance based on pathology results.  

## 2022-01-15 NOTE — Anesthesia Procedure Notes (Signed)
Procedure Name: MAC Date/Time: 01/15/2022 8:27 AM  Performed by: Lieutenant Diego, CRNAPre-anesthesia Checklist: Patient identified, Emergency Drugs available, Suction available, Patient being monitored and Timeout performed Patient Re-evaluated:Patient Re-evaluated prior to induction Oxygen Delivery Method: Nasal cannula Preoxygenation: Pre-oxygenation with 100% oxygen Induction Type: IV induction

## 2022-01-15 NOTE — Anesthesia Preprocedure Evaluation (Addendum)
Anesthesia Evaluation  Patient identified by MRN, date of birth, ID band Patient awake    Reviewed: Allergy & Precautions, H&P , NPO status , Patient's Chart, lab work & pertinent test results  History of Anesthesia Complications (+) history of anesthetic complications (very angry after endoscopy under propofol)  Airway Mallampati: III  TM Distance: >3 FB Neck ROM: Full    Dental  (+) Dental Advisory Given, Missing   Pulmonary shortness of breath and with exertion, asthma , COPD,  COPD inhaler, Current Smoker and Patient abstained from smoking.   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),  narcotic dependent  Abdominal   Peds negative pediatric ROS (+)  Hematology negative hematology ROS (+)   Anesthesia Other Findings Abdominal pain, epigastric At risk for central sleep apnea Circadian rhythm sleep disorder, irregular sleep wake type Class 2 obesity due to excess calories without serious comorbidity with body mass index (BMI) of 39.0 to 39.9 in adult Gastroesophageal reflux disease without esophagitis Hypersomnia, 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, unspecified with other opioid-induced disorder (HCC) Snoring On chronic opioids   Reproductive/Obstetrics negative OB ROS                             Anesthesia Physical Anesthesia Plan  ASA: 3  Anesthesia Plan: General   Post-op Pain  Management: Minimal or no pain anticipated   Induction: Intravenous  PONV Risk Score and Plan: 1 and Propofol infusion  Airway Management Planned: Nasal Cannula and Natural Airway  Additional Equipment:   Intra-op Plan:   Post-operative Plan:   Informed Consent: I have reviewed the patients History and Physical, chart, labs and discussed the procedure including the risks, benefits and alternatives for the proposed anesthesia with the patient or authorized representative who has indicated his/her understanding and acceptance.     Dental advisory given  Plan Discussed with: CRNA and Surgeon  Anesthesia Plan Comments:        Anesthesia Quick Evaluation

## 2022-01-15 NOTE — Transfer of Care (Signed)
Immediate Anesthesia Transfer of Care Note  Patient: Kathryn Rodriguez  Procedure(s) Performed: COLONOSCOPY WITH PROPOFOL POLYPECTOMY  Patient Location: Endoscopy Unit  Anesthesia Type:MAC  Level of Consciousness: awake  Airway & Oxygen Therapy: Patient Spontanous Breathing  Post-op Assessment: Report given to RN and Post -op Vital signs reviewed and stable  Post vital signs: Reviewed and stable  Last Vitals:  Vitals Value Taken Time  BP    Temp    Pulse    Resp    SpO2      Last Pain:  Vitals:   01/15/22 0821  TempSrc:   PainSc: 10-Worst pain ever      Patients Stated Pain Goal: 10 (79/72/82 0601)  Complications: No notable events documented.

## 2022-01-15 NOTE — H&P (Signed)
Kathryn Rodriguez is an 46 y.o. female.   Chief Complaint: screening colonoscopy and family history colon cancer HPI: 46 y/o F with PMH anxiety, asthma, COPD, depression, hyperlipidemia and chronic pelvic floor dysfunction, coming for screening colonoscopy. The patient has never had a colonoscopy in the past.  The patient denies having any complaints such as melena, hematochezia, abdominal pain or distention, change in her bowel movement consistency or frequency, no changes in weight recently. [name] reports chronic lower abdominopelvic pain due to pelvic floor dysfunction, not new.  Patient had 2 grandparents with history of colon cancer,one of them was diagnosed when she was older than 25   Past Medical History:  Diagnosis Date   Abdominal pain, epigastric 10/19/2017   Anxiety    Asthma    COPD (chronic obstructive pulmonary disease) (HCC)    Depression    High cholesterol    Migraines    Pelvic floor dysfunction     Past Surgical History:  Procedure Laterality Date   BIOPSY  10/26/2017   Procedure: BIOPSY;  Surgeon: [name], MD;  Location: [facility];  Service: Endoscopy;;  biopsy from GE junction   c sections     x 3   CHOLECYSTECTOMY     complete hysterectomy     ESOPHAGOGASTRODUODENOSCOPY N/A 10/26/2017   Procedure: ESOPHAGOGASTRODUODENOSCOPY (EGD);  Surgeon: [name], MD;  Location: [facility];  Service: Endoscopy;  Laterality: N/A;  12:25   ESOPHAGOGASTRODUODENOSCOPY N/A 11/18/2017   Procedure: ESOPHAGOGASTRODUODENOSCOPY (EGD);  Surgeon: [name], MD;  Location: [facility];  Service: Gastroenterology;  Laterality: N/A;   EUS N/A 11/18/2017   Procedure: UPPER ENDOSCOPIC ULTRASOUND (EUS) LINEAR;  Surgeon: [name], MD;  Location: [facility];  Service: Gastroenterology;  Laterality: N/A;   FINE NEEDLE ASPIRATION  11/18/2017   Procedure: FINE NEEDLE ASPIRATION (FNA) LINEAR;  Surgeon: [name], MD;  Location: [facility];  Service: Gastroenterology;;   TONSILLECTOMY     WISDOM TOOTH EXTRACTION      Family History  Problem Relation Age of Onset   Diabetes Mother    Colon cancer Maternal Grandmother    Colon cancer Maternal Grandfather    Prostate cancer Maternal Grandfather    Social History:  reports that she has been smoking cigarettes. She has a 32.00 pack-year smoking history. She has never used smokeless tobacco. She reports current alcohol use. She reports that she does not currently use drugs.  Allergies:  Allergies  Allergen Reactions   Amoxicillin Hives and Itching   Cefuroxime Hives and Itching    Medications Prior to Admission  Medication Sig Dispense Refill   ALPRAZolam (XANAX) 1 MG tablet Take 1 mg by mouth 3 (three) times daily.     atorvastatin (LIPITOR) 10 MG tablet Take 10 mg by mouth daily.     caffeine 200 MG TABS tablet Take 100-200 mg by mouth 2 (two) times daily as needed (stay awake).     cetirizine (ZYRTEC) 10 MG tablet Take 10 mg by mouth at bedtime.      cyclobenzaprine (FLEXERIL) 10 MG tablet Take 10 mg by mouth 3 (three) times daily as needed for muscle spasms.     docusate sodium (COLACE) 100 MG capsule Take 100 mg by mouth 2 (two) times daily.     HYDROcodone-acetaminophen (NORCO) 10-325 MG tablet Take 1 tablet by mouth 5 (five) times daily as needed for severe pain.     ibuprofen (ADVIL,MOTRIN) 800 MG tablet Take 800 mg by mouth  3 (three) times daily as needed (inflammation).  10   Melatonin 5 MG CAPS Take 5 mg by mouth at bedtime as needed (sleep).     omeprazole (PRILOSEC) 40 MG capsule Take 40 mg by mouth daily.     ondansetron (ZOFRAN) 4 MG tablet Take 4 mg by mouth every 8 (eight) hours as needed for nausea or vomiting.     polyethylene glycol-electrolytes (TRILYTE) 420 g solution Take 4,000 mLs by mouth as directed. 4000 mL 0   Rimegepant Sulfate (NURTEC) 75 MG TBDP Take 75 mg by mouth every other day.     topiramate (TOPAMAX) 200 MG tablet Take 200 mg  by mouth 2 (two) times daily.     venlafaxine (EFFEXOR) 75 MG tablet Take 75 mg by mouth 2 (two) times daily.     vitamin B-12 (CYANOCOBALAMIN) 1000 MCG tablet Take 2,000 mcg by mouth daily.     Vitamin D, Ergocalciferol, (DRISDOL) 1.25 MG (50000 UNIT) CAPS capsule Take 50,000 Units by mouth every Thursday.     aspirin-acetaminophen-caffeine (EXCEDRIN MIGRAINE) 250-250-65 MG tablet Take 2 tablets by mouth daily as needed for migraine.     Carboxymethylcellulose Sodium (EYE DROPS OP) Place 1 drop into both eyes daily as needed (irritation).     diclofenac Sodium (VOLTAREN) 1 % GEL Apply 1 Application topically 4 (four) times daily as needed (pain).     SEREVENT DISKUS 50 MCG/DOSE diskus inhaler Inhale 2 puffs into the lungs daily as needed (shortness of breath).  11   triamcinolone cream (KENALOG) 0.1 % Apply 1 application  topically 3 (three) times daily as needed (rash).     VENTOLIN HFA 108 (90 Base) MCG/ACT inhaler Inhale 2 puffs into the lungs every 4 (four) hours as needed for wheezing.   11    No results found for this or any previous visit (from the past 48 hour(s)). No results found.  Review of Systems  Constitutional: Negative.   HENT: Negative.    Eyes: Negative.   Respiratory: Negative.    Cardiovascular: Negative.   Gastrointestinal:  Positive for abdominal pain.  Endocrine: Negative.   Genitourinary: Negative.   Musculoskeletal: Negative.   Skin: Negative.   Allergic/Immunologic: Negative.   Neurological: Negative.   Hematological: Negative.   Psychiatric/Behavioral: Negative.      Blood pressure 117/74, pulse 67, temperature 98.2 F (36.8 C), temperature source Oral, resp. rate 16, height '5\' 7"'$  (1.702 m), weight 99.8 kg, SpO2 98 %. Physical Exam  GENERAL: The patient is AO x3, in no acute distress. HEENT: Head is normocephalic and atraumatic. EOMI are intact. Mouth is well hydrated and without lesions. NECK: Supple. No masses LUNGS: Clear to auscultation. No  presence of rhonchi/wheezing/rales. Adequate chest expansion HEART: RRR, normal s1 and s2. ABDOMEN: Soft, nontender, no guarding, no peritoneal signs, and nondistended. BS +. No masses. EXTREMITIES: Without any cyanosis, clubbing, rash, lesions or edema. NEUROLOGIC: AOx3, no focal motor deficit. SKIN: no jaundice, no rashes  Assessment/Plan 46 y/o F with PMH anxiety, asthma, COPD, depression, hyperlipidemia and chronic pelvic floor dysfunction, coming for screening colonoscopy.  she has a family history of colon cancer.  We will proceed with colonoscopy.  Harvel Quale, MD 01/15/2022, 7:37 AM

## 2022-01-16 LAB — SURGICAL PATHOLOGY

## 2022-01-21 ENCOUNTER — Encounter (HOSPITAL_COMMUNITY): Payer: Self-pay | Admitting: Gastroenterology

## 2022-07-16 ENCOUNTER — Other Ambulatory Visit (HOSPITAL_COMMUNITY): Payer: Self-pay | Admitting: Nurse Practitioner

## 2022-07-16 DIAGNOSIS — K8689 Other specified diseases of pancreas: Secondary | ICD-10-CM

## 2022-08-15 ENCOUNTER — Encounter (HOSPITAL_COMMUNITY): Payer: Self-pay

## 2022-08-15 ENCOUNTER — Ambulatory Visit (HOSPITAL_COMMUNITY): Payer: BC Managed Care – PPO

## 2022-09-09 ENCOUNTER — Ambulatory Visit (HOSPITAL_COMMUNITY): Payer: BC Managed Care – PPO

## 2022-09-16 ENCOUNTER — Ambulatory Visit (HOSPITAL_COMMUNITY)
Admission: RE | Admit: 2022-09-16 | Discharge: 2022-09-16 | Disposition: A | Discharge status: Home / Self Care | Payer: BC Managed Care – PPO | Source: Ambulatory Visit | Attending: Nurse Practitioner | Admitting: Nurse Practitioner

## 2022-09-16 ENCOUNTER — Other Ambulatory Visit (HOSPITAL_COMMUNITY): Payer: Self-pay | Admitting: Nurse Practitioner

## 2022-09-16 ENCOUNTER — Ambulatory Visit (HOSPITAL_COMMUNITY): Payer: BC Managed Care – PPO

## 2022-09-16 ENCOUNTER — Encounter (HOSPITAL_COMMUNITY): Payer: Self-pay

## 2022-09-16 DIAGNOSIS — K8689 Other specified diseases of pancreas: Secondary | ICD-10-CM

## 2022-09-16 MED ORDER — GADOBUTROL 1 MMOL/ML IV SOLN
10.0000 mL | Freq: Once | INTRAVENOUS | Status: AC | PRN
  Administered 2022-09-16: 10 mL via INTRAVENOUS

## 2022-10-30 ENCOUNTER — Encounter: Payer: Self-pay | Admitting: *Deleted

## 2022-10-30 ENCOUNTER — Ambulatory Visit (INDEPENDENT_AMBULATORY_CARE_PROVIDER_SITE_OTHER): Payer: BC Managed Care – PPO | Admitting: Gastroenterology

## 2022-10-30 ENCOUNTER — Encounter (INDEPENDENT_AMBULATORY_CARE_PROVIDER_SITE_OTHER): Payer: Self-pay | Admitting: Gastroenterology

## 2022-10-30 VITALS — BP 114/83 | HR 78 | Temp 97.9°F | Ht 67.0 in | Wt 230.6 lb

## 2022-10-30 DIAGNOSIS — R1012 Left upper quadrant pain: Secondary | ICD-10-CM | POA: Diagnosis not present

## 2022-10-30 DIAGNOSIS — K219 Gastro-esophageal reflux disease without esophagitis: Secondary | ICD-10-CM

## 2022-10-30 DIAGNOSIS — R110 Nausea: Secondary | ICD-10-CM

## 2022-10-30 MED ORDER — ESOMEPRAZOLE MAGNESIUM 40 MG PO CPDR: 40.0000 mg | DELAYED_RELEASE_CAPSULE | Freq: Every day | ORAL | 3 refills | Status: DC

## 2022-10-30 NOTE — Progress Notes (Addendum)
Referring Provider: Gareth Morgan, MD Primary Care Physician:  Gareth Morgan, MD Primary GI Physician: Dr. Levon Hedger   Chief Complaint  Patient presents with   Abdominal Pain    Referred for abdominal pain and nausea. Started 5 years ago. Pain in lower left side. Nausea every day. Takes at least zofran 8mg  daily.    HPI:   Kathryn Rodriguez is a 47 y.o. female with past medical history of abdominal pain, anxiety, asthma, COPD, depression, high cholesterol, pelvic floor dysfunction   Patient presenting today for nausea and abdominal pain.    She reports LLQ pain x5 years. Noting she has the pain is present all day, everyday. 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 She had pain at time of her last colonoscopy. She has a BM 1-2x/week. She sometimes strains and other times does not. She has some diarrhea at times, this is baseline for her. Denies rectal bleeding or melena. She endorses nausea but no vomiting, taking zofran 8mg . She does not have an appetite. No weight loss. Eating does not affect her nausea. Eating makes her pain worse depending on what she eats. No improvement in symptoms except with zofran. She takes norco, was taking 7 per day but she has tried to cut down and is taking 5 per day. She has early satiety.   She is taking omeprazole 40mg  daily, though notes she does not always remember to take them though reports she has some breakthrough heartburn at times. No dysphagia. She would like to try nexium if possible.   NSAID use: takes ibuprofen 800mg  BID-TID  Social hx: smokes 1 PPD no etoh, vapes  Fam hx: grandmother and grandpa both had CRC, unsure of ages   MR abd w wo 09/2022  Diminished size of a multilobulated cystic lesion of the superior aspect of the proximal pancreatic tail, measuring 1.8 x 1.7 cm, previously 3.0 x 2.5 cm. No solid component or suspicious contrast enhancement. Given patient age and behavior,  this is almost certainly a partially resolved pseudocyst. Given size and very indolent behavior over a long period of follow-up dating back to at least 2019, no further follow-up or characterization is required for this benign lesion. 2. Hepatomegaly and hepatic steatosis. 3. Status post cholecystectomy. Last Colonoscopy:2023- Two 3 to 6 mm polyps in the ascending colon,                            removed with a cold snare. Resected and retrieved.                           - One 3 mm polyp in the descending colon, removed                            with a cold snare. Resected and retrieved.                           - The distal rectum and anal verge are normal on                            retroflexion view. (2 TAs)  Last Endoscopy:2019- Normal esophagus.                           -  Z-line irregular, 34 cm from the incisors.                           - 2 cm hiatal hernia.                           - A large amount of food (residue) in the stomach.                           - Normal pylorus.                           - Normal duodenal bulb and second portion of the                            duodenum.                           - No specimens collected.  Recommendations:  Repeat tcs 7 years   Past Medical History:  Diagnosis Date   Abdominal pain, epigastric 10/19/2017   Anxiety    Asthma    COPD (chronic obstructive pulmonary disease) (HCC)    Depression    High cholesterol    Migraines    Pelvic floor dysfunction     Past Surgical History:  Procedure Laterality Date   BIOPSY  10/26/2017   Procedure: BIOPSY;  Surgeon: Malissa Hippo, MD;  [facility];  Service: Endoscopy;;  biopsy from GE junction   c sections     x 3   CHOLECYSTECTOMY     COLONOSCOPY WITH PROPOFOL N/A 01/15/2022   Procedure: COLONOSCOPY WITH PROPOFOL;  Surgeon: Dolores Frame, MD;  [facility];  Service: Gastroenterology;  Laterality: N/A;  805 ASA 2   complete  hysterectomy     ESOPHAGOGASTRODUODENOSCOPY N/A 10/26/2017   Procedure: ESOPHAGOGASTRODUODENOSCOPY (EGD);  Surgeon: Malissa Hippo, MD;  [facility];  Service: Endoscopy;  Laterality: N/A;  12:25   ESOPHAGOGASTRODUODENOSCOPY N/A 11/18/2017   Procedure: ESOPHAGOGASTRODUODENOSCOPY (EGD);  Surgeon: Lemar Lofty., MD;  Location: [facility];  Service: Gastroenterology;  Laterality: N/A;   EUS N/A 11/18/2017   Procedure: UPPER ENDOSCOPIC ULTRASOUND (EUS) LINEAR;  Surgeon: Lemar Lofty., MD;  Location: [facility];  Service: Gastroenterology;  Laterality: N/A;   FINE NEEDLE ASPIRATION  11/18/2017   Procedure: FINE NEEDLE ASPIRATION (FNA) LINEAR;  Surgeon: Lemar Lofty., MD;  Location: [facility];  Service: Gastroenterology;;   POLYPECTOMY  01/15/2022   Procedure: POLYPECTOMY;  Surgeon: Marguerita Merles, Reuel Boom, MD;  [facility];  Service: Gastroenterology;;   TONSILLECTOMY     WISDOM TOOTH EXTRACTION      Current Outpatient Medications  Medication Sig Dispense Refill   ALPRAZolam (XANAX) 1 MG tablet Take 1 mg by mouth 3 (three) times daily.     aspirin-acetaminophen-caffeine (EXCEDRIN MIGRAINE) 250-250-65 MG tablet Take 2 tablets by mouth daily as needed for migraine.     atorvastatin (LIPITOR) 20 MG tablet Take 20 mg by mouth daily.     caffeine 200 MG TABS tablet Take 100-200 mg by mouth 2 (two) times daily as needed (stay awake).     Carboxymethylcellulose Sodium (EYE DROPS OP) Place 1 drop into both eyes daily as needed (irritation).  cetirizine (ZYRTEC) 10 MG tablet Take 10 mg by mouth at bedtime.      cyclobenzaprine (FLEXERIL) 10 MG tablet Take 10 mg by mouth 3 (three) times daily as needed for muscle spasms.     diclofenac Sodium (VOLTAREN) 1 % GEL Apply 1 Application topically 4 (four) times daily as needed (pain).     docusate sodium (COLACE) 100 MG capsule Take 100 mg by mouth 2 (two) times daily.     HYDROcodone-acetaminophen  (NORCO) 10-325 MG tablet Take 1 tablet by mouth 5 (five) times daily as needed for severe pain.     ibuprofen (ADVIL,MOTRIN) 800 MG tablet Take 800 mg by mouth 3 (three) times daily as needed (inflammation).  10   Melatonin 5 MG CAPS Take 5 mg by mouth at bedtime as needed (sleep).     omeprazole (PRILOSEC) 40 MG capsule Take 40 mg by mouth daily.     ondansetron (ZOFRAN) 4 MG tablet Take 4 mg by mouth every 8 (eight) hours as needed for nausea or vomiting.     SEREVENT DISKUS 50 MCG/DOSE diskus inhaler Inhale 2 puffs into the lungs daily as needed (shortness of breath).  11   topiramate (TOPAMAX) 200 MG tablet Take 200 mg by mouth 2 (two) times daily.     triamcinolone cream (KENALOG) 0.1 % Apply 1 application  topically 3 (three) times daily as needed (rash).     venlafaxine (EFFEXOR) 75 MG tablet Take 75 mg by mouth 2 (two) times daily.     VENTOLIN HFA 108 (90 Base) MCG/ACT inhaler Inhale 2 puffs into the lungs every 4 (four) hours as needed for wheezing.   11   vitamin B-12 (CYANOCOBALAMIN) 1000 MCG tablet Take 2,000 mcg by mouth daily.     Vitamin D, Ergocalciferol, (DRISDOL) 1.25 MG (50000 UNIT) CAPS capsule Take 50,000 Units by mouth every Thursday.     No current facility-administered medications for this visit.    Allergies as of 10/30/2022 - Review Complete 01/15/2022  Allergen Reaction Noted   Amoxicillin Hives and Itching 01/13/2022   Cefuroxime Hives and Itching 01/13/2022    Family History  Problem Relation Age of Onset   Diabetes Mother    Colon cancer Maternal Grandmother    Colon cancer Maternal Grandfather    Prostate cancer Maternal Grandfather     Social History   Socioeconomic History   Marital status: Significant Other    Spouse name: Not on file   Number of children: Not on file   Years of education: Not on file   Highest education level: Not on file  Occupational History   Not on file  Tobacco Use   Smoking status: Every Day    Current packs/day:  1.00    Average packs/day: 1 pack/day for 32.0 years (32.0 ttl pk-yrs)    Types: Cigarettes    Passive exposure: Current   Smokeless tobacco: Never   Tobacco comments:    1 pack day since age 17  Substance and Sexual Activity   Alcohol use: Yes    Comment: rarely   Drug use: Not Currently   Sexual activity: Not on file  Other Topics Concern   Not on file  Social History Narrative   Left handed   Lives with boyfriend   Caffeine use: 2-2 drinks per day   Social Determinants of Health   Financial Resource Strain: Not on file  Food Insecurity: Not on file  Transportation Needs: Not on file  Physical Activity: Not on file  Stress: Not on file  Social Connections: Unknown (07/23/2021)   Received from [facility], [facility]   Social Network    Social Network: Not on file    Review of systems General: negative for malaise, night sweats, fever, chills, weight los Neck: Negative for lumps, goiter, pain and significant neck swelling Resp: Negative for cough, wheezing, dyspnea at rest CV: Negative for chest pain, leg swelling, palpitations, orthopnea GI: denies melena, hematochezia, nausea, vomiting, diarrhea, constipation, dysphagia, odyonophagia, early satiety or unintentional weight loss.  MSK: Negative for joint pain or swelling, back pain, and muscle pain. Derm: Negative for itching or rash Psych: Denies depression, anxiety, memory loss, confusion. No homicidal or suicidal ideation.  Heme: Negative for prolonged bleeding, bruising easily, and swollen nodes. Endocrine: Negative for cold or heat intolerance, polyuria, polydipsia and goiter. Neuro: negative for tremor, gait imbalance, syncope and seizures. The remainder of the review of systems is noncontributory.  Physical Exam: BP 114/83 (BP Location: Left Arm, Patient Position: Sitting, Cuff Size: Large)   Pulse 78   Temp 97.9 F (36.6 C) (Oral)   Ht 5\' 7"  (1.702 m)   Wt 230 lb 9.6 oz (104.6 kg)   BMI 36.12 kg/m   General:   Alert and oriented. No distress noted. Pleasant and cooperative.  Head:  Normocephalic and atraumatic. Eyes:  Conjuctiva clear without scleral icterus. Mouth:  Oral mucosa pink and moist. Good dentition. No lesions. Heart: Normal rate and rhythm, s1 and s2 heart sounds present.  Lungs: Clear lung sounds in all lobes. Respirations equal and unlabored. Abdomen:  +BS, soft, and non-distended. TTP of left abdomen. No rebound or guarding. No HSM or masses noted. Derm: No palmar erythema or jaundice Msk:  Symmetrical without gross deformities. Normal posture. Extremities:  Without edema. Neurologic:  Alert and  oriented x4 Psych:  Alert and cooperative. Normal mood and affect.  Invalid input(s): "6 MONTHS"   ASSESSMENT: Kathryn Rodriguez is a 47 y.o. female presenting today for left abdominal pain and nausea  She reports left sided abdominal pain x5 years, worse with movement such as in a car. She notes nausea daily, decreased appetite, recent Colonoscopy with no findings to explain lower abdominal pain and recent MR abdomen without findings to explain it. She takes ibuprofen 800mg  2-3 times per day which raises concern for PUD, gastritis, duodenitis, recommend EGD for further evaluation, needs to decrease NSAID use as much as possible. Will stop omeprazole as this is not working well. She requested to try nexium. Indications, risks and benefits of procedure discussed in detail with patient. Patient verbalized understanding and is in agreement to proceed with EGD  PLAN:  Stop omeprazole  2.  Start nexium 40mg   3. Schedule EGD-ASA II 4.  Try to avoid NSAIDs as much as possible   All questions were answered, patient verbalized understanding and is in agreement with plan as outlined above.   Follow Up: 4 months   Royer Cristobal L. Jeanmarie Hubert, MSN, APRN, AGNP-C Adult-Gerontology Nurse Practitioner [facility]  I have reviewed the note and agree with the APP's  assessment as described in this progress note  Katrinka Blazing, MD Gastroenterology and Hepatology [facility]

## 2022-10-30 NOTE — H&P (View-Only) (Signed)
Referring Provider: Gareth Morgan, MD Primary Care Physician:  Gareth Morgan, MD Primary GI Physician: Dr. Levon Hedger   Chief Complaint  Patient presents with   Abdominal Pain    Referred for abdominal pain and nausea. Started 5 years ago. Pain in lower left side. Nausea every day. Takes at least zofran 8mg  daily.    HPI:   Kathryn Rodriguez is a 47 y.o. female with past medical history of abdominal pain, anxiety, asthma, COPD, depression, high cholesterol, pelvic floor dysfunction   Patient presenting today for nausea and abdominal pain.    She reports LLQ pain x5 years. Noting she has the pain is present all day, everyday. 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 She had pain at time of her last colonoscopy. She has a BM 1-2x/week. She sometimes strains and other times does not. She has some diarrhea at times, this is baseline for her. Denies rectal bleeding or melena. She endorses nausea but no vomiting, taking zofran 8mg . She does not have an appetite. No weight loss. Eating does not affect her nausea. Eating makes her pain worse depending on what she eats. No improvement in symptoms except with zofran. She takes norco, was taking 7 per day but she has tried to cut down and is taking 5 per day. She has early satiety.   She is taking omeprazole 40mg  daily, though notes she does not always remember to take them though reports she has some breakthrough heartburn at times. No dysphagia. She would like to try nexium if possible.   NSAID use: takes ibuprofen 800mg  BID-TID  Social hx: smokes 1 PPD no etoh, vapes  Fam hx: grandmother and grandpa both had CRC, unsure of ages   MR abd w wo 09/2022  Diminished size of a multilobulated cystic lesion of the superior aspect of the proximal pancreatic tail, measuring 1.8 x 1.7 cm, previously 3.0 x 2.5 cm. No solid component or suspicious contrast enhancement. Given patient age and behavior,  this is almost certainly a partially resolved pseudocyst. Given size and very indolent behavior over a long period of follow-up dating back to at least 2019, no further follow-up or characterization is required for this benign lesion. 2. Hepatomegaly and hepatic steatosis. 3. Status post cholecystectomy. Last Colonoscopy:2023- Two 3 to 6 mm polyps in the ascending colon,                            removed with a cold snare. Resected and retrieved.                           - One 3 mm polyp in the descending colon, removed                            with a cold snare. Resected and retrieved.                           - The distal rectum and anal verge are normal on                            retroflexion view. (2 TAs)  Last Endoscopy:2019- Normal esophagus.                           -  Z-line irregular, 34 cm from the incisors.                           - 2 cm hiatal hernia.                           - A large amount of food (residue) in the stomach.                           - Normal pylorus.                           - Normal duodenal bulb and second portion of the                            duodenum.                           - No specimens collected.  Recommendations:  Repeat tcs 7 years   Past Medical History:  Diagnosis Date   Abdominal pain, epigastric 10/19/2017   Anxiety    Asthma    COPD (chronic obstructive pulmonary disease) (HCC)    Depression    High cholesterol    Migraines    Pelvic floor dysfunction     Past Surgical History:  Procedure Laterality Date   BIOPSY  10/26/2017   Procedure: BIOPSY;  Surgeon: Malissa Hippo, MD;  [facility];  Service: Endoscopy;;  biopsy from GE junction   c sections     x 3   CHOLECYSTECTOMY     COLONOSCOPY WITH PROPOFOL N/A 01/15/2022   Procedure: COLONOSCOPY WITH PROPOFOL;  Surgeon: Dolores Frame, MD;  [facility];  Service: Gastroenterology;  Laterality: N/A;  805 ASA 2   complete  hysterectomy     ESOPHAGOGASTRODUODENOSCOPY N/A 10/26/2017   Procedure: ESOPHAGOGASTRODUODENOSCOPY (EGD);  Surgeon: Malissa Hippo, MD;  [facility];  Service: Endoscopy;  Laterality: N/A;  12:25   ESOPHAGOGASTRODUODENOSCOPY N/A 11/18/2017   Procedure: ESOPHAGOGASTRODUODENOSCOPY (EGD);  Surgeon: Lemar Lofty., MD;  Location: [facility];  Service: Gastroenterology;  Laterality: N/A;   EUS N/A 11/18/2017   Procedure: UPPER ENDOSCOPIC ULTRASOUND (EUS) LINEAR;  Surgeon: Lemar Lofty., MD;  Location: [facility];  Service: Gastroenterology;  Laterality: N/A;   FINE NEEDLE ASPIRATION  11/18/2017   Procedure: FINE NEEDLE ASPIRATION (FNA) LINEAR;  Surgeon: Lemar Lofty., MD;  Location: [facility];  Service: Gastroenterology;;   POLYPECTOMY  01/15/2022   Procedure: POLYPECTOMY;  Surgeon: Marguerita Merles, Reuel Boom, MD;  [facility]: [facility];  Service: Gastroenterology;;   TONSILLECTOMY     WISDOM TOOTH EXTRACTION      Current Outpatient Medications  Medication Sig Dispense Refill   ALPRAZolam (XANAX) 1 MG tablet Take 1 mg by mouth 3 (three) times daily.     aspirin-acetaminophen-caffeine (EXCEDRIN MIGRAINE) 250-250-65 MG tablet Take 2 tablets by mouth daily as needed for migraine.     atorvastatin (LIPITOR) 20 MG tablet Take 20 mg by mouth daily.     caffeine 200 MG TABS tablet Take 100-200 mg by mouth 2 (two) times daily as needed (stay awake).     Carboxymethylcellulose Sodium (EYE DROPS OP) Place 1 drop into both eyes daily as needed (irritation).  cetirizine (ZYRTEC) 10 MG tablet Take 10 mg by mouth at bedtime.      cyclobenzaprine (FLEXERIL) 10 MG tablet Take 10 mg by mouth 3 (three) times daily as needed for muscle spasms.     diclofenac Sodium (VOLTAREN) 1 % GEL Apply 1 Application topically 4 (four) times daily as needed (pain).     docusate sodium (COLACE) 100 MG capsule Take 100 mg by mouth 2 (two) times daily.     HYDROcodone-acetaminophen  (NORCO) 10-325 MG tablet Take 1 tablet by mouth 5 (five) times daily as needed for severe pain.     ibuprofen (ADVIL,MOTRIN) 800 MG tablet Take 800 mg by mouth 3 (three) times daily as needed (inflammation).  10   Melatonin 5 MG CAPS Take 5 mg by mouth at bedtime as needed (sleep).     omeprazole (PRILOSEC) 40 MG capsule Take 40 mg by mouth daily.     ondansetron (ZOFRAN) 4 MG tablet Take 4 mg by mouth every 8 (eight) hours as needed for nausea or vomiting.     SEREVENT DISKUS 50 MCG/DOSE diskus inhaler Inhale 2 puffs into the lungs daily as needed (shortness of breath).  11   topiramate (TOPAMAX) 200 MG tablet Take 200 mg by mouth 2 (two) times daily.     triamcinolone cream (KENALOG) 0.1 % Apply 1 application  topically 3 (three) times daily as needed (rash).     venlafaxine (EFFEXOR) 75 MG tablet Take 75 mg by mouth 2 (two) times daily.     VENTOLIN HFA 108 (90 Base) MCG/ACT inhaler Inhale 2 puffs into the lungs every 4 (four) hours as needed for wheezing.   11   vitamin B-12 (CYANOCOBALAMIN) 1000 MCG tablet Take 2,000 mcg by mouth daily.     Vitamin D, Ergocalciferol, (DRISDOL) 1.25 MG (50000 UNIT) CAPS capsule Take 50,000 Units by mouth every Thursday.     No current facility-administered medications for this visit.    Allergies as of 10/30/2022 - Review Complete 01/15/2022  Allergen Reaction Noted   Amoxicillin Hives and Itching 01/13/2022   Cefuroxime Hives and Itching 01/13/2022    Family History  Problem Relation Age of Onset   Diabetes Mother    Colon cancer Maternal Grandmother    Colon cancer Maternal Grandfather    Prostate cancer Maternal Grandfather     Social History   Socioeconomic History   Marital status: Significant Other    Spouse name: Not on file   Number of children: Not on file   Years of education: Not on file   Highest education level: Not on file  Occupational History   Not on file  Tobacco Use   Smoking status: Every Day    Current packs/day:  1.00    Average packs/day: 1 pack/day for 32.0 years (32.0 ttl pk-yrs)    Types: Cigarettes    Passive exposure: Current   Smokeless tobacco: Never   Tobacco comments:    1 pack day since age 17  Substance and Sexual Activity   Alcohol use: Yes    Comment: rarely   Drug use: Not Currently   Sexual activity: Not on file  Other Topics Concern   Not on file  Social History Narrative   Left handed   Lives with boyfriend   Caffeine use: 2-2 drinks per day   Social Determinants of Health   Financial Resource Strain: Not on file  Food Insecurity: Not on file  Transportation Needs: Not on file  Physical Activity: Not on file  Stress: Not on file  Social Connections: Unknown (07/23/2021)   Received from [facility]   Social Network    Social Network: Not on file    Review of systems General: negative for malaise, night sweats, fever, chills, weight los Neck: Negative for lumps, goiter, pain and significant neck swelling Resp: Negative for cough, wheezing, dyspnea at rest CV: Negative for chest pain, leg swelling, palpitations, orthopnea GI: denies melena, hematochezia, nausea, vomiting, diarrhea, constipation, dysphagia, odyonophagia, early satiety or unintentional weight loss.  MSK: Negative for joint pain or swelling, back pain, and muscle pain. Derm: Negative for itching or rash Psych: Denies depression, anxiety, memory loss, confusion. No homicidal or suicidal ideation.  Heme: Negative for prolonged bleeding, bruising easily, and swollen nodes. Endocrine: Negative for cold or heat intolerance, polyuria, polydipsia and goiter. Neuro: negative for tremor, gait imbalance, syncope and seizures. The remainder of the review of systems is noncontributory.  Physical Exam: BP 114/83 (BP Location: Left Arm, Patient Position: Sitting, Cuff Size: Large)   Pulse 78   Temp 97.9 F (36.6 C) (Oral)   Ht 5\' 7"  (1.702 m)   Wt 230 lb 9.6 oz (104.6 kg)   BMI 36.12 kg/m   General:   Alert and oriented. No distress noted. Pleasant and cooperative.  Head:  Normocephalic and atraumatic. Eyes:  Conjuctiva clear without scleral icterus. Mouth:  Oral mucosa pink and moist. Good dentition. No lesions. Heart: Normal rate and rhythm, s1 and s2 heart sounds present.  Lungs: Clear lung sounds in all lobes. Respirations equal and unlabored. Abdomen:  +BS, soft, and non-distended. TTP of left abdomen. No rebound or guarding. No HSM or masses noted. Derm: No palmar erythema or jaundice Msk:  Symmetrical without gross deformities. Normal posture. Extremities:  Without edema. Neurologic:  Alert and  oriented x4 Psych:  Alert and cooperative. Normal mood and affect.  Invalid input(s): "6 MONTHS"   ASSESSMENT: Sonni Vanhoosier is a 47 y.o. female presenting today for left abdominal pain and nausea  She reports left sided abdominal pain x5 years, worse with movement such as in a car. She notes nausea daily, decreased appetite, recent Colonoscopy with no findings to explain lower abdominal pain and recent MR abdomen without findings to explain it. She takes ibuprofen 800mg  2-3 times per day which raises concern for PUD, gastritis, duodenitis, recommend EGD for further evaluation, needs to decrease NSAID use as much as possible. Will stop omeprazole as this is not working well. She requested to try nexium. Indications, risks and benefits of procedure discussed in detail with patient. Patient verbalized understanding and is in agreement to proceed with EGD  PLAN:  Stop omeprazole  2.  Start nexium 40mg   3. Schedule EGD-ASA II 4.  Try to avoid NSAIDs as much as possible   All questions were answered, patient verbalized understanding and is in agreement with plan as outlined above.   Follow Up: 4 months   Royer Cristobal L. Jeanmarie Hubert, MSN, APRN, AGNP-C Adult-Gerontology Nurse Practitioner [facility]  I have reviewed the note and agree with the APP's  assessment as described in this progress note  Katrinka Blazing, MD Gastroenterology and Hepatology [facility]

## 2022-10-30 NOTE — Patient Instructions (Signed)
Stop omeprazole Start nexium 40mg  daily We will schedule upper endoscopy  Try to avoid all NSAIDs. (advil, aleve, naproxen, goody powder, ibuprofen) as these can be very hard on your GI tract, causing inflammation, ulcers and damage to the lining of your GI tract.   Follow up 4 months

## 2022-11-13 ENCOUNTER — Other Ambulatory Visit (INDEPENDENT_AMBULATORY_CARE_PROVIDER_SITE_OTHER): Payer: Self-pay

## 2022-11-13 NOTE — Telephone Encounter (Signed)
Last seen 10/30/2022 by Kathryn Rodriguez has procedure (EGD) on 10/14/2022 by [facility]

## 2022-11-14 ENCOUNTER — Other Ambulatory Visit: Payer: Self-pay

## 2022-11-14 ENCOUNTER — Ambulatory Visit (HOSPITAL_COMMUNITY): Payer: BC Managed Care – PPO | Admitting: Anesthesiology

## 2022-11-14 ENCOUNTER — Ambulatory Visit (HOSPITAL_COMMUNITY)
Admission: RE | Admit: 2022-11-14 | Discharge: 2022-11-14 | Disposition: A | Discharge status: Home / Self Care | Payer: BC Managed Care – PPO | Attending: Gastroenterology | Admitting: Gastroenterology

## 2022-11-14 ENCOUNTER — Encounter (HOSPITAL_COMMUNITY): Payer: Self-pay | Admitting: Gastroenterology

## 2022-11-14 ENCOUNTER — Encounter (HOSPITAL_COMMUNITY)
Admission: RE | Disposition: A | Discharge status: Home / Self Care | Payer: Self-pay | Source: Home / Self Care | Attending: Gastroenterology

## 2022-11-14 DIAGNOSIS — R1012 Left upper quadrant pain: Secondary | ICD-10-CM

## 2022-11-14 DIAGNOSIS — Z79891 Long term (current) use of opiate analgesic: Secondary | ICD-10-CM | POA: Insufficient documentation

## 2022-11-14 DIAGNOSIS — R197 Diarrhea, unspecified: Secondary | ICD-10-CM | POA: Diagnosis not present

## 2022-11-14 DIAGNOSIS — K3189 Other diseases of stomach and duodenum: Secondary | ICD-10-CM | POA: Insufficient documentation

## 2022-11-14 DIAGNOSIS — F32A Depression, unspecified: Secondary | ICD-10-CM | POA: Diagnosis not present

## 2022-11-14 DIAGNOSIS — Z791 Long term (current) use of non-steroidal anti-inflammatories (NSAID): Secondary | ICD-10-CM | POA: Diagnosis not present

## 2022-11-14 DIAGNOSIS — F419 Anxiety disorder, unspecified: Secondary | ICD-10-CM | POA: Diagnosis not present

## 2022-11-14 DIAGNOSIS — R6881 Early satiety: Secondary | ICD-10-CM | POA: Diagnosis not present

## 2022-11-14 DIAGNOSIS — E7800 Pure hypercholesterolemia, unspecified: Secondary | ICD-10-CM | POA: Diagnosis not present

## 2022-11-14 DIAGNOSIS — F17210 Nicotine dependence, cigarettes, uncomplicated: Secondary | ICD-10-CM | POA: Insufficient documentation

## 2022-11-14 DIAGNOSIS — K319 Disease of stomach and duodenum, unspecified: Secondary | ICD-10-CM

## 2022-11-14 DIAGNOSIS — K449 Diaphragmatic hernia without obstruction or gangrene: Secondary | ICD-10-CM | POA: Diagnosis not present

## 2022-11-14 DIAGNOSIS — R109 Unspecified abdominal pain: Secondary | ICD-10-CM | POA: Insufficient documentation

## 2022-11-14 DIAGNOSIS — J4489 Other specified chronic obstructive pulmonary disease: Secondary | ICD-10-CM | POA: Diagnosis not present

## 2022-11-14 DIAGNOSIS — R110 Nausea: Secondary | ICD-10-CM | POA: Insufficient documentation

## 2022-11-14 DIAGNOSIS — D132 Benign neoplasm of duodenum: Secondary | ICD-10-CM

## 2022-11-14 DIAGNOSIS — Z79899 Other long term (current) drug therapy: Secondary | ICD-10-CM | POA: Insufficient documentation

## 2022-11-14 HISTORY — PX: BIOPSY: SHX5522

## 2022-11-14 HISTORY — PX: ESOPHAGOGASTRODUODENOSCOPY (EGD) WITH PROPOFOL: SHX5813

## 2022-11-14 SURGERY — ESOPHAGOGASTRODUODENOSCOPY (EGD) WITH PROPOFOL
Anesthesia: General

## 2022-11-14 MED ORDER — PROPOFOL 500 MG/50ML IV EMUL
INTRAVENOUS | Status: DC | PRN
  Administered 2022-11-14: 150 ug/kg/min via INTRAVENOUS

## 2022-11-14 MED ORDER — PROPOFOL 10 MG/ML IV BOLUS
INTRAVENOUS | Status: DC | PRN
  Administered 2022-11-14: 30 mg via INTRAVENOUS
  Administered 2022-11-14: 70 mg via INTRAVENOUS

## 2022-11-14 MED ORDER — LIDOCAINE HCL (CARDIAC) PF 100 MG/5ML IV SOSY
PREFILLED_SYRINGE | INTRAVENOUS | Status: DC | PRN
  Administered 2022-11-14: 100 mg via INTRATRACHEAL

## 2022-11-14 MED ORDER — LACTATED RINGERS IV SOLN: INTRAVENOUS | Status: DC

## 2022-11-14 MED ORDER — GLYCOPYRROLATE PF 0.2 MG/ML IJ SOSY
PREFILLED_SYRINGE | INTRAMUSCULAR | Status: DC | PRN
Start: 2022-11-14 — End: 2022-11-14
  Administered 2022-11-14: .2 mg via INTRAVENOUS

## 2022-11-14 NOTE — Op Note (Signed)
[facility] Patient Name: Kathryn Rodriguez Procedure Date: 11/14/2022 9:27 AM MRN: [id number] Date of Birth: [birth year] Attending MD: Katrinka Blazing , , [id number] CSN: [id number] Age: 47 Admit Type: Outpatient Procedure:                Upper GI endoscopy Indications:              Abdominal pain Providers:                Katrinka Blazing, Buel Ream. Thomasena Edis RN, RN, Pandora Leiter Tech., Technician Referring MD:              Medicines:                Monitored Anesthesia Care Complications:            No immediate complications. Estimated Blood Loss:     Estimated blood loss: none. Procedure:                Pre-Anesthesia Assessment:                           - Prior to the procedure, a History and Physical                            was performed, and patient medications, allergies                            and sensitivities were reviewed. The patient's                            tolerance of previous anesthesia was reviewed.                           - The risks and benefits of the procedure and the                            sedation options and risks were discussed with the                            patient. All questions were answered and informed                            consent was obtained.                           - ASA Grade Assessment: II - A patient with mild                            systemic disease.                           After obtaining informed consent, the endoscope was                            passed under direct vision. Throughout the  procedure, the patient's blood pressure, pulse, and                            oxygen saturations were monitored continuously. The                            [id number]-H190 ([id number]) scope was introduced through the                            mouth, and advanced to the second part of duodenum.                            The upper GI endoscopy was accomplished without                             difficulty. The patient tolerated the procedure                            well. Scope In: 10:21:58 AM Scope Out: 10:27:56 AM Total Procedure Duration: 0 hours 5 minutes 58 seconds  Findings:      A 2 cm hiatal hernia was present.      The gastroesophageal flap valve was visualized endoscopically and       classified as Hill Grade II (fold present, opens with respiration).      The entire examined stomach was normal. Biopsies were taken with a cold       forceps for Helicobacter pylori testing.      The examined duodenum was normal. Biopsies were taken with a cold       forceps for histology. Impression:               - 2 cm hiatal hernia.                           - Normal stomach. Biopsied.                           - Normal examined duodenum. Biopsied. Moderate Sedation:      Per Anesthesia Care Recommendation:           - Discharge patient to home (ambulatory).                           - Resume previous diet.                           - Await pathology results.                           - No high dose aspirin (including Excedrin),                            ibuprofen, naproxen, or other non-steroidal                            anti-inflammatory drugs. Procedure Code(s):        --- Professional ---  30865, Esophagogastroduodenoscopy, flexible,                            transoral; with biopsy, single or multiple Diagnosis Code(s):        --- Professional ---                           K44.9, Diaphragmatic hernia without obstruction or                            gangrene                           R10.9, Unspecified abdominal pain CPT copyright 2022 [employer]. All rights reserved. The codes documented in this report are preliminary and upon coder review may  be revised to meet current compliance requirements. Katrinka Blazing, MD Katrinka Blazing,  11/14/2022 10:34:35 AM This report has been signed  electronically. Number of Addenda: 0

## 2022-11-14 NOTE — Interval H&P Note (Signed)
History and Physical Interval Note:  11/14/2022 8:17 AM  Kathryn Rodriguez  has presented today for surgery, with the diagnosis of nausea, LUQ pain.  The various methods of treatment have been discussed with the patient and family. After consideration of risks, benefits and other options for treatment, the patient has consented to  Procedure(s) with comments: ESOPHAGOGASTRODUODENOSCOPY (EGD) WITH PROPOFOL (N/A) - 9:30 am, asa 2 as a surgical intervention.  The patient's history has been reviewed, patient examined, no change in status, stable for surgery.  I have reviewed the patient's chart and labs.  Questions were answered to the patient's satisfaction.     Katrinka Blazing Mayorga

## 2022-11-14 NOTE — Discharge Instructions (Addendum)
You are being discharged to home.  Resume your previous diet.  We are waiting for your pathology results.  

## 2022-11-14 NOTE — Anesthesia Preprocedure Evaluation (Addendum)
Anesthesia Evaluation  Patient identified by MRN, date of birth, ID band Patient awake    Reviewed: Allergy & Precautions, H&P , NPO status , Patient's Chart, lab work & pertinent test results  Airway Mallampati: II  TM Distance: >3 FB Neck ROM: Full    Dental  (+) Dental Advisory Given, Chipped, Missing,    Pulmonary asthma , COPD,  COPD inhaler, Current Smoker and Patient abstained from smoking.   Pulmonary exam normal breath sounds clear to auscultation       Cardiovascular negative cardio ROS  Rhythm:Regular Rate:Bradycardia     Neuro/Psych  Headaches PSYCHIATRIC DISORDERS Anxiety Depression     Neuromuscular disease    GI/Hepatic Neg liver ROS,GERD  Medicated and Poorly Controlled,,  Endo/Other  negative endocrine ROS    Renal/GU negative Renal ROS  negative genitourinary   Musculoskeletal negative musculoskeletal ROS (+)    Abdominal   Peds negative pediatric ROS (+)  Hematology negative hematology ROS (+)   Anesthesia Other Findings   Reproductive/Obstetrics negative OB ROS                             Anesthesia Physical Anesthesia Plan  ASA: 3  Anesthesia Plan: General   Post-op Pain Management: Minimal or no pain anticipated   Induction: Intravenous  PONV Risk Score and Plan: 1 and Propofol infusion  Airway Management Planned: Nasal Cannula and Natural Airway  Additional Equipment:   Intra-op Plan:   Post-operative Plan:   Informed Consent: I have reviewed the patients History and Physical, chart, labs and discussed the procedure including the risks, benefits and alternatives for the proposed anesthesia with the patient or authorized representative who has indicated his/her understanding and acceptance.     Dental advisory given  Plan Discussed with: CRNA and Surgeon  Anesthesia Plan Comments:        Anesthesia Quick Evaluation

## 2022-11-14 NOTE — Transfer of Care (Signed)
Immediate Anesthesia Transfer of Care Note  Patient: Kathryn Rodriguez  Procedure(s) Performed: ESOPHAGOGASTRODUODENOSCOPY (EGD) WITH PROPOFOL BIOPSY  Patient Location: PACU  Anesthesia Type:General  Level of Consciousness: sedated  Airway & Oxygen Therapy: Patient Spontanous Breathing  Post-op Assessment: Report given to RN and Post -op Vital signs reviewed and stable  Post vital signs: Reviewed and stable  Last Vitals:  Vitals Value Taken Time  BP 105/71 11/14/22 1031  Temp 36.4 C 11/14/22 1031  Pulse 61 11/14/22 1031  Resp 16 11/14/22 1031  SpO2 95 % 11/14/22 1031    Last Pain:  Vitals:   11/14/22 1031  TempSrc: Axillary  PainSc:       Patients Stated Pain Goal: 9 (11/14/22 0753)  Complications: No notable events documented.

## 2022-11-14 NOTE — Anesthesia Postprocedure Evaluation (Signed)
Anesthesia Post Note  Patient: Kathryn Rodriguez  Procedure(s) Performed: ESOPHAGOGASTRODUODENOSCOPY (EGD) WITH PROPOFOL BIOPSY  Patient location during evaluation: Phase II Anesthesia Type: General Level of consciousness: awake and alert and oriented Pain management: pain level controlled Vital Signs Assessment: post-procedure vital signs reviewed and stable Respiratory status: spontaneous breathing, nonlabored ventilation and respiratory function stable Cardiovascular status: blood pressure returned to baseline and stable Postop Assessment: no apparent nausea or vomiting Anesthetic complications: no  No notable events documented.   Last Vitals:  Vitals:   11/14/22 0753 11/14/22 1031  BP: 103/65 105/71  Pulse: (!) 53 61  Resp: 15 16  Temp: 36.5 C (!) 36.4 C  SpO2: 98% 95%    Last Pain:  Vitals:   11/14/22 1038  TempSrc:   PainSc: 0-No pain                 Abner Ardis C Robb Sibal

## 2022-11-16 ENCOUNTER — Encounter (INDEPENDENT_AMBULATORY_CARE_PROVIDER_SITE_OTHER): Payer: Self-pay

## 2022-11-17 LAB — SURGICAL PATHOLOGY

## 2022-11-17 NOTE — Telephone Encounter (Signed)
I need more information about your headache. You said the headache has been there since the grogginess wore off after the endoscopy  on 9/6. So has it been constant or does it come and go? Where exactly is the pain and describe the pain. Have you ever had a headache like this one? You have taken 2 excedrins but they did not help. Have you tried anything else?

## 2022-11-17 NOTE — Telephone Encounter (Signed)
Patient will need to have refills sent to pcp. These are not prescribed by [place].

## 2022-11-17 NOTE — Telephone Encounter (Signed)
Toniann Fail, I am just seeing this message. Since Dr. Levon Hedger is back tomorrow, please address with him. Thank you.

## 2022-11-17 NOTE — Telephone Encounter (Signed)
Left message to return call 

## 2022-11-19 ENCOUNTER — Encounter (INDEPENDENT_AMBULATORY_CARE_PROVIDER_SITE_OTHER): Payer: Self-pay | Admitting: *Deleted

## 2022-12-03 ENCOUNTER — Encounter (HOSPITAL_COMMUNITY): Payer: Self-pay | Admitting: Gastroenterology

## 2022-12-15 ENCOUNTER — Other Ambulatory Visit (INDEPENDENT_AMBULATORY_CARE_PROVIDER_SITE_OTHER): Payer: Self-pay | Admitting: Gastroenterology

## 2022-12-15 NOTE — Telephone Encounter (Signed)
Please send to patient pcp.

## 2023-02-23 ENCOUNTER — Ambulatory Visit (INDEPENDENT_AMBULATORY_CARE_PROVIDER_SITE_OTHER): Payer: BC Managed Care – PPO | Admitting: Gastroenterology

## 2023-02-24 ENCOUNTER — Ambulatory Visit (INDEPENDENT_AMBULATORY_CARE_PROVIDER_SITE_OTHER): Payer: BC Managed Care – PPO | Admitting: Gastroenterology

## 2023-04-02 ENCOUNTER — Encounter (INDEPENDENT_AMBULATORY_CARE_PROVIDER_SITE_OTHER): Payer: Self-pay | Admitting: Gastroenterology

## 2023-04-02 ENCOUNTER — Encounter (INDEPENDENT_AMBULATORY_CARE_PROVIDER_SITE_OTHER): Payer: Self-pay | Admitting: *Deleted

## 2023-04-02 ENCOUNTER — Ambulatory Visit (INDEPENDENT_AMBULATORY_CARE_PROVIDER_SITE_OTHER): Payer: BC Managed Care – PPO | Admitting: Gastroenterology

## 2023-04-02 ENCOUNTER — Telehealth (INDEPENDENT_AMBULATORY_CARE_PROVIDER_SITE_OTHER): Payer: Self-pay | Admitting: *Deleted

## 2023-04-02 VITALS — BP 120/85 | HR 72 | Temp 97.6°F | Ht 67.0 in | Wt 230.5 lb

## 2023-04-02 DIAGNOSIS — R110 Nausea: Secondary | ICD-10-CM

## 2023-04-02 DIAGNOSIS — R1031 Right lower quadrant pain: Secondary | ICD-10-CM | POA: Insufficient documentation

## 2023-04-02 DIAGNOSIS — K219 Gastro-esophageal reflux disease without esophagitis: Secondary | ICD-10-CM

## 2023-04-02 DIAGNOSIS — R109 Unspecified abdominal pain: Secondary | ICD-10-CM | POA: Diagnosis not present

## 2023-04-02 DIAGNOSIS — R1030 Lower abdominal pain, unspecified: Secondary | ICD-10-CM | POA: Insufficient documentation

## 2023-04-02 MED ORDER — ESOMEPRAZOLE MAGNESIUM 40 MG PO CPDR
40.0000 mg | DELAYED_RELEASE_CAPSULE | Freq: Two times a day (BID) | ORAL | 3 refills | Status: DC
Start: 2023-04-02 — End: 2023-07-30

## 2023-04-02 MED ORDER — PROMETHAZINE HCL 12.5 MG PO TABS: 12.5000 mg | ORAL_TABLET | Freq: Three times a day (TID) | ORAL | 0 refills | Status: DC | PRN

## 2023-04-02 NOTE — Telephone Encounter (Signed)
[employer] PA for CTA: Order ID: [id number]       Authorized  Approval Valid Through: 04/02/2023 - 05/31/2023

## 2023-04-02 NOTE — Progress Notes (Addendum)
Referring Provider: Lindaann Slough, DO Primary Care Physician:  Lindaann Slough, DO Primary GI Physician: Dr. Levon Hedger   Chief Complaint  Patient presents with   Nausea    Follow up on nausea. Taking zofran 8 mg tid and takes ginger and states she is still having nausea.    Gastroesophageal Reflux    Follow up on GERD. Takes nexium in the morning and asking for it to be increased to twice a day.    Abdominal Pain    Follow up on abdominal pain. States her pain is getting worse. States pain is stabbing and cramping pain that is every day all day. Taking dilaudid 2mg  that she gets from the pain management.    HPI:   Kathryn Rodriguez is a 48 y.o. female with past medical history of abdominal pain, anxiety, asthma, COPD, depression, high cholesterol, pelvic floor dysfunction   Patient presenting today for follow up of abdominal pain, nausea and GERD  Last seen August 2024.  At that time presenting with left lower quadrant pain x 5 years.  Having a BM 1-2 times per week.  Has nausea but no vomiting.  Taking Zofran 8 mg.  Taking Norco for her pain.  On omeprazole 40 mg daily though notes she symptoms forgets to take her dose.  Requesting to try Nexium if possible.  Patient recommended to stop omeprazole, start Nexium 40 mg, schedule EGD, try to avoid NSAIDs  Present: Continues to have abdominal pain, usually starts in LLQ and radiates across her abdomen. Eating makes pain worse. Having a mix of constipation and diarrhea. Defecation does not change her pain but makes her nauseated.  Having a Bm 1-2 times per week. Taking colace. Does not feel that stools are hard and usually feels that her BMs are sufficient when she goes. Denies rectal bleeding or melena. Appetite is not good, states her SO tries to get her to eat but she typically does not want to. No weight loss. She is taking dilaudid every 6 hours but reports this does not improve her pain. She is planning to discuss this with her  pain management doctor. She denies vomiting with her nausea. Does have some early satiety.   Feels like GERD may be somewhat improved on nexium 40mg  daily. She is wanting to increase to BID as she still feels that she is having GERD symptoms a few times per week with acid regurgitation and heartburn. She sometimes drinks chocolate milk or eats some bread which seems to help.    MR abd w wo 09/2022  Diminished size of a multilobulated cystic lesion of the superior aspect of the proximal pancreatic tail, measuring 1.8 x 1.7 cm, previously 3.0 x 2.5 cm. No solid component or suspicious contrast enhancement. Given patient age and behavior, this is almost certainly a partially resolved pseudocyst. Given size and very indolent behavior over a long period of follow-up dating back to at least 2019, no further follow-up or characterization is required for this benign lesion. 2. Hepatomegaly and hepatic steatosis. 3. Status post cholecystectomy. Last Colonoscopy:2023- Two 3 to 6 mm polyps in the ascending colon,                            removed with a cold snare. Resected and retrieved.                           - One  3 mm polyp in the descending colon, removed                            with a cold snare. Resected and retrieved.                           - The distal rectum and anal verge are normal on                            retroflexion view. (2 TAs)  Last Endoscopy:11/2022- 2 cm hiatal hernia.                           - Normal stomach. Biopsied.                           - Normal examined duodenum. Biopsied. A. SMALL BOWEL, BIOPSY: - Benign small bowel mucosa with no significant pathologic changes   B. GASTRIC, BIOPSY: - Mild reactive gastropathy. - Negative for H. pylori on HE stain - No intestinal metaplasia, dysplasia, or malignancy. Recommendations:    Past Medical History:  Diagnosis Date   Abdominal pain, epigastric 10/19/2017   Anxiety    Asthma    COPD (chronic  obstructive pulmonary disease) (HCC)    Depression    High cholesterol    Migraines    Pelvic floor dysfunction     Past Surgical History:  Procedure Laterality Date   BIOPSY  10/26/2017   Procedure: BIOPSY;  Surgeon: Malissa Hippo, MD;  [facility]: [facility];  Service: Endoscopy;;  biopsy from GE junction   BIOPSY  11/14/2022   Procedure: BIOPSY;  Surgeon: Dolores Frame, MD;  Location: [facility];  Service: Gastroenterology;;   c sections     x 3   CHOLECYSTECTOMY     COLONOSCOPY WITH PROPOFOL N/A 01/15/2022   Procedure: COLONOSCOPY WITH PROPOFOL;  Surgeon: Dolores Frame, MD;  [facility]: [facility];  Service: Gastroenterology;  Laterality: N/A;  805 ASA 2   complete hysterectomy     ESOPHAGOGASTRODUODENOSCOPY N/A 10/26/2017   Procedure: ESOPHAGOGASTRODUODENOSCOPY (EGD);  Surgeon: Malissa Hippo, MD;  [facility]: [facility];  Service: Endoscopy;  Laterality: N/A;  12:25   ESOPHAGOGASTRODUODENOSCOPY N/A 11/18/2017   Procedure: ESOPHAGOGASTRODUODENOSCOPY (EGD);  Surgeon: Lemar Lofty., MD;  Location: [facility];  Service: Gastroenterology;  Laterality: N/A;   ESOPHAGOGASTRODUODENOSCOPY (EGD) WITH PROPOFOL N/A 11/14/2022   Procedure: ESOPHAGOGASTRODUODENOSCOPY (EGD) WITH PROPOFOL;  Surgeon: Dolores Frame, MD;  Location: [facility];  Service: Gastroenterology;  Laterality: N/A;  9:30 am, asa 2   EUS N/A 11/18/2017   Procedure: UPPER ENDOSCOPIC ULTRASOUND (EUS) LINEAR;  Surgeon: Lemar Lofty., MD;  Location: [facility];  Service: Gastroenterology;  Laterality: N/A;   FINE NEEDLE ASPIRATION  11/18/2017   Procedure: FINE NEEDLE ASPIRATION (FNA) LINEAR;  Surgeon: Lemar Lofty., MD;  Location: [facility];  Service: Gastroenterology;;   POLYPECTOMY  01/15/2022   Procedure: POLYPECTOMY;  Surgeon: Marguerita Merles, Reuel Boom, MD;  Location: [facility];  Service: Gastroenterology;;   TONSILLECTOMY     WISDOM TOOTH  EXTRACTION      Current Outpatient Medications  Medication Sig Dispense Refill   ALPRAZolam (XANAX) 1 MG tablet Take 1 mg by mouth 3 (three) times daily.  atorvastatin (LIPITOR) 20 MG tablet Take 20 mg by mouth daily.     caffeine 200 MG TABS tablet Take 100-200 mg by mouth 2 (two) times daily as needed (stay awake).     Carboxymethylcellulose Sodium (EYE DROPS OP) Place 1 drop into both eyes daily as needed (irritation).     cetirizine (ZYRTEC) 10 MG tablet Take 10 mg by mouth at bedtime.      cyclobenzaprine (FLEXERIL) 10 MG tablet Take 10 mg by mouth 3 (three) times daily as needed for muscle spasms.     diclofenac Sodium (VOLTAREN) 1 % GEL Apply 1 Application topically 4 (four) times daily as needed (pain).     docusate sodium (COLACE) 100 MG capsule Take 100 mg by mouth 2 (two) times daily.     esomeprazole (NEXIUM) 40 MG capsule Take 1 capsule (40 mg total) by mouth daily. 60 capsule 3   estradiol (ESTRACE) 0.5 MG tablet Take 0.5 mg by mouth daily.     gabapentin (NEURONTIN) 100 MG capsule Take 100 mg by mouth every 12 (twelve) hours.     HYDROmorphone (DILAUDID) 2 MG tablet Take 2 mg by mouth every 6 (six) hours as needed.     MELATONIN PO Take 10 mg by mouth at bedtime as needed (sleep).     ondansetron (ZOFRAN) 8 MG tablet Take 8 mg by mouth every 8 (eight) hours as needed for nausea or vomiting.     SEREVENT DISKUS 50 MCG/DOSE diskus inhaler Inhale 2 puffs into the lungs daily as needed (shortness of breath).  11   topiramate (TOPAMAX) 200 MG tablet Take 200 mg by mouth 2 (two) times daily.     triamcinolone cream (KENALOG) 0.1 % Apply 1 application  topically 3 (three) times daily as needed (rash).     venlafaxine (EFFEXOR) 100 MG tablet Take 100 mg by mouth 2 (two) times daily.     VENTOLIN HFA 108 (90 Base) MCG/ACT inhaler Inhale 2 puffs into the lungs every 4 (four) hours as needed for wheezing.   11   vitamin B-12 (CYANOCOBALAMIN) 1000 MCG tablet Take 2,000 mcg by mouth  daily.     Vitamin D, Ergocalciferol, (DRISDOL) 1.25 MG (50000 UNIT) CAPS capsule Take 50,000 Units by mouth every Thursday. (Patient not taking: Reported on 04/02/2023)     No current facility-administered medications for this visit.    Allergies as of 04/02/2023 - Review Complete 04/02/2023  Allergen Reaction Noted   Amoxicillin Hives and Itching 01/13/2022   Cefuroxime Hives and Itching 01/13/2022    Family History  Problem Relation Age of Onset   Diabetes Mother    Colon cancer Maternal Grandmother    Colon cancer Maternal Grandfather    Prostate cancer Maternal Grandfather     Social History   Socioeconomic History   Marital status: Significant Other    Spouse name: Not on file   Number of children: Not on file   Years of education: Not on file   Highest education level: Not on file  Occupational History   Not on file  Tobacco Use   Smoking status: Every Day    Current packs/day: 1.00    Average packs/day: 1 pack/day for 32.0 years (32.0 ttl pk-yrs)    Types: Cigarettes    Passive exposure: Current   Smokeless tobacco: Never   Tobacco comments:    1 pack day since age 72  Vaping Use   Vaping status: Some Days  Substance and Sexual Activity   Alcohol use:  Yes    Comment: rarely   Drug use: Not Currently   Sexual activity: Not on file  Other Topics Concern   Not on file  Social History Narrative   Left handed   Lives with boyfriend   Caffeine use: 2-2 drinks per day   Social Drivers of Health   Financial Resource Strain: Low Risk  (11/18/2022)   Received [facility]   Overall Financial Resource Strain (CARDIA)    Difficulty of Paying Living Expenses: Not hard at all  Food Insecurity: No Food Insecurity (11/18/2022)   Received from [facility]   Hunger Vital Sign    Worried About Running Out of Food in the Last Year: Never true    Ran Out of Food in the Last Year: Never true  Transportation Needs: No Transportation Needs (11/18/2022)    Received from [facility] - Transportation    Lack of Transportation (Medical): No    Lack of Transportation (Non-Medical): No  Physical Activity: Inactive (11/05/2022)   Received from [facility]   Exercise Vital Sign    Days of Exercise per Week: 0 days    Minutes of Exercise per Session: 0 min  Stress: No Stress Concern Present (11/18/2022)   Received from [facility] - Occupational Stress Questionnaire    Feeling of Stress : Not at all  Recent Concern: Stress - Stress Concern Present (11/05/2022)   Received from [facility] - Occupational Stress Questionnaire    Feeling of Stress : Rather much  Social Connections: Moderately Integrated (11/05/2022)   Received from [facility]   Social Connection and Isolation Panel [NHANES]    Frequency of Communication with Friends and Family: Three times a week    Frequency of Social Gatherings with Friends and Family: Twice a week    Attends Religious Services: 1 to 4 times per year    Active Member of [employer] or Organizations: No    Attends Engineer, structural: More than 4 times per year    Marital Status: Never married    Review of systems General: negative for malaise, night sweats, fever, chills, weight loss Neck: Negative for lumps, goiter, pain and significant neck swelling Resp: Negative for cough, wheezing, dyspnea at rest CV: Negative for chest pain, leg swelling, palpitations, orthopnea GI: denies melena, hematochezia, vomiting, diarrhea, constipation, dysphagia, odyonophagia, early satiety or unintentional weight loss. +abdominal pain +decreased appetite +nausea +GERD MSK: Negative for joint pain or swelling, back pain, and muscle pain. Derm: Negative for itching or rash Psych: Denies depression, anxiety, memory loss, confusion. No homicidal or suicidal ideation.  Heme: Negative for prolonged bleeding, bruising  easily, and swollen nodes. Endocrine: Negative for cold or heat intolerance, polyuria, polydipsia and goiter. Neuro: negative for tremor, gait imbalance, syncope and seizures. The remainder of the review of systems is noncontributory.  Physical Exam: BP 120/85   Pulse 72   Temp 97.6 F (36.4 C) (Oral)   Ht 5\' 7"  (1.702 m)   Wt 230 lb 8 oz (104.6 kg)   BMI 36.10 kg/m  General:   Alert and oriented. No distress noted. Pleasant and cooperative.  Head:  Normocephalic and atraumatic. Eyes:  Conjuctiva clear without scleral icterus. Mouth:  Oral mucosa pink and moist. Good dentition. No lesions. Heart: Normal rate and rhythm, s1 and s2 heart sounds present.  Lungs: Clear lung sounds in  all lobes. Respirations equal and unlabored. Abdomen:  +BS, soft, and non-distended. Lower abdominal tenderness, worse on LLQ. No rebound or guarding. No HSM or masses noted. Derm: No palmar erythema or jaundice Msk:  Symmetrical without gross deformities. Normal posture. Extremities:  Without edema. Neurologic:  Alert and  oriented x4 Psych:  Alert and cooperative. Normal mood and affect.  Invalid input(s): "6 MONTHS"   ASSESSMENT: Kathryn Rodriguez is a 48 y.o. female presenting today for follow up of nausea, abdominal pain and GERD  GERD: Some improvement with switching from omeprazole to Nexium 40 mg daily though still having symptoms few times per week.  Recommend increasing to twice daily dosing to see if this better controls her symptoms.  She continue with good reflux precautions.  Nausea: No vomiting.  Nausea has been ongoing for many years.  EGD as above without findings to explain her symptoms.  She is on high-dose opiates, could be some aspect of gastroparesis.  Taking Zofran with some relief though notes sometimes nausea so severe Zofran does not help.  Will send Phenergan 12.5 mg every 8 hours as needed though I did discuss with her she should take this while she is at home and not working  as medication can make her drowsy.  May consider GES off of opiates pending what CT angio shows.  Abdominal pain: Chronic for the past 5 to 6 years.  Patient has had thorough workup to include upper endoscopy, colonoscopy MRI and CT abdomen pelvis with contrast without findings to explain her symptoms.  She is on high-dose opiates to include Dilaudid and states that pain is still consistent.  She does note worsening pain postprandially, though no rectal bleeding or weight loss.  Low suspicion for mesenteric ischemia though with postprandial worsening of her abdominal pain cannot completely rule this out.  Will proceed with CT angio abdomen pelvis with and without contrast to rule out ischemic causes for her pain.  She should continue to follow with pain management.    PLAN:  -CT Angio A/P w wo  -increase nexium 40mg  to BID  -continue zofran 8mg  TID PRN -phenergan 12.5mg  Q8H PRN for severe nausea -continue to follow with pain management -consider GES off of opiates pending CT findings  All questions were answered, patient verbalized understanding and is in agreement with plan as outlined above.   Follow Up: 4 months   Nishka Heide L. Jeanmarie Hubert, MSN, APRN, AGNP-C Adult-Gerontology Nurse Practitioner [facility]  I have reviewed the note and agree with the APP's assessment as described in this progress note  If presenting imaging changes concerning for constipation, may need to start on Movantik given the fact she is taking Dilaudid chronically.  Katrinka Blazing, MD Gastroenterology and Hepatology [facility]

## 2023-04-02 NOTE — Telephone Encounter (Signed)
[facility]  CT scheduled for 04/09/23, arrive at 1:15 pm to check in at [facility]

## 2023-04-02 NOTE — Patient Instructions (Signed)
-  we will order a special CT to look at blood flow to your intestines  -increase nexium 40mg  to BID  -continue zofran 8mg  as needed -phenergan 12.5mg  every 8 hours as needed for severe nausea, do not drive or operate machinery until you know how this will effect you as this medication can be sedating  Follow up 4 months

## 2023-04-07 NOTE — Telephone Encounter (Signed)
Pt read [employer].

## 2023-04-08 ENCOUNTER — Telehealth (INDEPENDENT_AMBULATORY_CARE_PROVIDER_SITE_OTHER): Payer: Self-pay | Admitting: Gastroenterology

## 2023-04-08 NOTE — Telephone Encounter (Signed)
Pt left voicemail stating she needs to cancel CT that is scheduled for tomorrow due to cost. Returned call to pt and advised her I would cancel this for her. Pt states she and Leeroy Bock spoke and states that if cost is to much they may need to wait until May.

## 2023-04-08 NOTE — Telephone Encounter (Signed)
Pt is aware to call back when ready to schedule

## 2023-04-09 ENCOUNTER — Ambulatory Visit (HOSPITAL_COMMUNITY): Payer: BC Managed Care – PPO

## 2023-04-09 ENCOUNTER — Encounter (HOSPITAL_COMMUNITY): Payer: Self-pay

## 2023-06-25 ENCOUNTER — Encounter (INDEPENDENT_AMBULATORY_CARE_PROVIDER_SITE_OTHER): Payer: Self-pay | Admitting: Gastroenterology

## 2023-07-13 ENCOUNTER — Ambulatory Visit (INDEPENDENT_AMBULATORY_CARE_PROVIDER_SITE_OTHER): Admitting: Gastroenterology

## 2023-07-30 ENCOUNTER — Other Ambulatory Visit (INDEPENDENT_AMBULATORY_CARE_PROVIDER_SITE_OTHER): Payer: Self-pay | Admitting: Gastroenterology

## 2023-08-05 ENCOUNTER — Encounter (INDEPENDENT_AMBULATORY_CARE_PROVIDER_SITE_OTHER): Payer: Self-pay | Admitting: Gastroenterology

## 2023-08-06 ENCOUNTER — Ambulatory Visit (INDEPENDENT_AMBULATORY_CARE_PROVIDER_SITE_OTHER): Admitting: Gastroenterology

## 2023-08-06 ENCOUNTER — Encounter (INDEPENDENT_AMBULATORY_CARE_PROVIDER_SITE_OTHER): Payer: Self-pay | Admitting: Gastroenterology

## 2023-08-06 VITALS — BP 120/79 | HR 65 | Temp 97.1°F | Ht 65.0 in | Wt 240.9 lb

## 2023-08-06 DIAGNOSIS — K5909 Other constipation: Secondary | ICD-10-CM | POA: Diagnosis not present

## 2023-08-06 DIAGNOSIS — R112 Nausea with vomiting, unspecified: Secondary | ICD-10-CM

## 2023-08-06 DIAGNOSIS — R109 Unspecified abdominal pain: Secondary | ICD-10-CM

## 2023-08-06 DIAGNOSIS — R110 Nausea: Secondary | ICD-10-CM

## 2023-08-06 DIAGNOSIS — K5900 Constipation, unspecified: Secondary | ICD-10-CM | POA: Insufficient documentation

## 2023-08-06 DIAGNOSIS — R1030 Lower abdominal pain, unspecified: Secondary | ICD-10-CM

## 2023-08-06 DIAGNOSIS — K5904 Chronic idiopathic constipation: Secondary | ICD-10-CM

## 2023-08-06 DIAGNOSIS — G8929 Other chronic pain: Secondary | ICD-10-CM | POA: Diagnosis not present

## 2023-08-06 DIAGNOSIS — K219 Gastro-esophageal reflux disease without esophagitis: Secondary | ICD-10-CM

## 2023-08-06 MED ORDER — PROMETHAZINE HCL 12.5 MG PO TABS: 12.5000 mg | ORAL_TABLET | Freq: Three times a day (TID) | ORAL | 2 refills | Status: AC | PRN

## 2023-08-06 MED ORDER — LUBIPROSTONE 24 MCG PO CAPS: 24.0000 ug | ORAL_CAPSULE | Freq: Two times a day (BID) | ORAL | 3 refills | Status: DC

## 2023-08-06 NOTE — Patient Instructions (Signed)
 Close follow up with pain management Continue Zofran  as needed for nausea Can take Phenergan  as needed for breakthrough episodes of nausea despite taking Zofran  Start taking lubiprostone 24 mcg twice a day Continue Nexium  40 mg twice a day

## 2023-08-06 NOTE — Progress Notes (Addendum)
 Samantha Cress, M.D. Gastroenterology & Hepatology [facility] [address] [place], [place] [address]  Primary Care Physician: Hershel Los, DO [address] [place] [address]  I will communicate my assessment and recommendations to the referring MD via EMR.  Problems: Chronic abdominal pain, likely functional  History of Present Illness: Kathryn Rodriguez is a 47 y.o. female with PMH abdominal pain, anxiety, asthma, COPD, depression, high cholesterol, pelvic floor dysfunction,  who presents for follow up of chronic abdominal pain.  The patient was last seen on 04/02/2023. At that time, the patient was advised to increase Nexium  to 40 mg twice a day and to continue Zofran  as needed for nausea with intermittent use of Phenergan  for breakthrough episodes.  She had been following with pain management chronically for chronic abdominal pain for which she is receiving Dilaudid.  Was ordered a CT angio of the abdomen and pelvis but she canceled this due to cost.  Patient reports that since mid 2017 she has presented chronic pain in her lower abdomen, which is constant in severity. She reports that she is currently being managed by pain management with a combination of Dilaudid 2 mg every 6 hours as needed (usually takes it every 6 hours), as well as burprenorphine 450 mcg  - states they are increasing the dose soon to 600 mcg.  She is having a BM every couple of days, sometimes can increase to every week. She takes a stool softener daily but does not take any other laxative. States she does not eat often , but has not lost weight.  Takes Zofran  as needed for chronic nausea, very rarely takes it daily.   As long as she takes her Nexium  twice a day, she does not have any heartburn. States with once a day dosing she was still feeling significant improvement.   The patient denies having any vomiting, fever, chills,  hematochezia, melena, hematemesis,  diarrhea, jaundice, pruritus.  MR abd w wo 09/2022  Diminished size of a multilobulated cystic lesion of the superior aspect of the proximal pancreatic tail, measuring 1.8 x 1.7 cm, previously 3.0 x 2.5 cm. No solid component or suspicious contrast enhancement. Given patient age and behavior, this is almost certainly a partially resolved pseudocyst. Given size and very indolent behavior over a long period of follow-up dating back to at least 2019, no further follow-up or characterization is required for this benign lesion. 2. Hepatomegaly and hepatic steatosis. 3. Status post cholecystectomy.  Last Colonoscopy:2023- Two 3 to 6 mm polyps in the ascending colon,                            removed with a cold snare. Resected and retrieved.                           - One 3 mm polyp in the descending colon, removed                            with a cold snare. Resected and retrieved.                           - The distal rectum and anal verge are normal on  retroflexion view. (2 TAs)   Last Endoscopy:11/2022- 2 cm hiatal hernia.                           - Normal stomach. Biopsied.                           - Normal examined duodenum. Biopsied. A. SMALL BOWEL, BIOPSY: - Benign small bowel mucosa with no significant pathologic changes   B. GASTRIC, BIOPSY: - Mild reactive gastropathy. - Negative for H. pylori on HE stain - No intestinal metaplasia, dysplasia, or malignancy.  Past Medical History: Past Medical History:  Diagnosis Date   Abdominal pain, epigastric 10/19/2017   Anxiety    Asthma    COPD (chronic obstructive pulmonary disease) (HCC)    Depression    High cholesterol    Migraines    Pelvic floor dysfunction     Past Surgical History: Past Surgical History:  Procedure Laterality Date   BIOPSY  10/26/2017   Procedure: BIOPSY;  Surgeon: Ruby Corporal, MD;  Location: [facility];  Service: Endoscopy;;   biopsy from GE junction   BIOPSY  11/14/2022   Procedure: BIOPSY;  Surgeon: Urban Garden, MD;  Location: [facility];  Service: Gastroenterology;;   c sections     x 3   CHOLECYSTECTOMY     COLONOSCOPY WITH PROPOFOL  N/A 01/15/2022   Procedure: COLONOSCOPY WITH PROPOFOL ;  Surgeon: Urban Garden, MD;  Location: [facility];  Service: Gastroenterology;  Laterality: N/A;  805 ASA 2   complete hysterectomy     ESOPHAGOGASTRODUODENOSCOPY N/A 10/26/2017   Procedure: ESOPHAGOGASTRODUODENOSCOPY (EGD);  Surgeon: Ruby Corporal, MD;  [facility]: [facility];  Service: Endoscopy;  Laterality: N/A;  12:25   ESOPHAGOGASTRODUODENOSCOPY N/A 11/18/2017   Procedure: ESOPHAGOGASTRODUODENOSCOPY (EGD);  Surgeon: Normie Becton., MD;  Location: [facility];  Service: Gastroenterology;  Laterality: N/A;   ESOPHAGOGASTRODUODENOSCOPY (EGD) WITH PROPOFOL  N/A 11/14/2022   Procedure: ESOPHAGOGASTRODUODENOSCOPY (EGD) WITH PROPOFOL ;  Surgeon: Urban Garden, MD;  Location: [facility];  Service: Gastroenterology;  Laterality: N/A;  9:30 am, asa 2   EUS N/A 11/18/2017   Procedure: UPPER ENDOSCOPIC ULTRASOUND (EUS) LINEAR;  Surgeon: Normie Becton., MD;  Location: [facility];  Service: Gastroenterology;  Laterality: N/A;   FINE NEEDLE ASPIRATION  11/18/2017   Procedure: FINE NEEDLE ASPIRATION (FNA) LINEAR;  Surgeon: Normie Becton., MD;  Location: [facility];  Service: Gastroenterology;;   POLYPECTOMY  01/15/2022   Procedure: POLYPECTOMY;  Surgeon: Umberto Ganong, Bearl Limes, MD;  Location: [facility];  Service: Gastroenterology;;   TONSILLECTOMY     WISDOM TOOTH EXTRACTION      Family History: Family History  Problem Relation Age of Onset   Diabetes Mother    Colon cancer Maternal Grandmother    Colon cancer Maternal Grandfather    Prostate cancer Maternal Grandfather     Social History: Social History   Tobacco Use  Smoking Status Every Day    Current packs/day: 1.00   Average packs/day: 1 pack/day for 32.0 years (32.0 ttl pk-yrs)   Types: Cigarettes   Passive exposure: Current  Smokeless Tobacco Never  Tobacco Comments   1 pack day since age 65   Social History   Substance and Sexual Activity  Alcohol Use Yes   Comment: rarely   Social History   Substance and Sexual Activity  Drug Use Not Currently  Allergies: Allergies  Allergen Reactions   Amoxicillin Hives and Itching   Cefuroxime Hives and Itching    Medications: Current Outpatient Medications  Medication Sig Dispense Refill   ALPRAZolam (XANAX) 1 MG tablet Take 1 mg by mouth 3 (three) times daily.     atorvastatin (LIPITOR) 20 MG tablet Take 20 mg by mouth daily.     Buprenorphine HCl 450 MCG FILM Place 1 Film inside cheek.     caffeine 200 MG TABS tablet Take 100-200 mg by mouth 2 (two) times daily as needed (stay awake).     Carboxymethylcellulose Sodium (EYE DROPS OP) Place 1 drop into both eyes daily as needed (irritation).     cetirizine (ZYRTEC) 10 MG tablet Take 10 mg by mouth at bedtime.      cyclobenzaprine (FLEXERIL) 10 MG tablet Take 10 mg by mouth 3 (three) times daily as needed for muscle spasms.     diclofenac Sodium (VOLTAREN) 1 % GEL Apply 1 Application topically 4 (four) times daily as needed (pain).     docusate sodium (COLACE) 100 MG capsule Take 100 mg by mouth 2 (two) times daily.     esomeprazole  (NEXIUM ) 40 MG capsule TAKE ONE CAPSULE BY MOUTH TWICE DAILY BEFORE a meal 60 capsule 3   estradiol (ESTRACE) 0.5 MG tablet Take 0.5 mg by mouth daily.     HYDROmorphone (DILAUDID) 2 MG tablet Take 2 mg by mouth every 6 (six) hours as needed.     MELATONIN PO Take 10 mg by mouth at bedtime as needed (sleep).     ondansetron  (ZOFRAN ) 8 MG tablet Take 8 mg by mouth every 8 (eight) hours as needed for nausea or vomiting.     SEREVENT DISKUS 50 MCG/DOSE diskus inhaler Inhale 2 puffs into the lungs daily as needed (shortness of breath).  11    topiramate (TOPAMAX) 200 MG tablet Take 200 mg by mouth 2 (two) times daily.     triamcinolone cream (KENALOG) 0.1 % Apply 1 application  topically 3 (three) times daily as needed (rash).     venlafaxine (EFFEXOR) 100 MG tablet Take 100 mg by mouth 2 (two) times daily.     VENTOLIN HFA 108 (90 Base) MCG/ACT inhaler Inhale 2 puffs into the lungs every 4 (four) hours as needed for wheezing.   11   vitamin B-12 (CYANOCOBALAMIN) 1000 MCG tablet Take 2,000 mcg by mouth daily.     promethazine  (PHENERGAN ) 12.5 MG tablet Take 1 tablet (12.5 mg total) by mouth every 8 (eight) hours as needed for nausea or vomiting. (Patient not taking: Reported on 08/06/2023) 30 tablet 0   No current facility-administered medications for this visit.    Review of Systems: GENERAL: negative for malaise, night sweats HEENT: No changes in hearing or vision, no nose bleeds or other nasal problems. NECK: Negative for lumps, goiter, pain and significant neck swelling RESPIRATORY: Negative for cough, wheezing CARDIOVASCULAR: Negative for chest pain, leg swelling, palpitations, orthopnea GI: SEE HPI MUSCULOSKELETAL: Negative for joint pain or swelling, back pain, and muscle pain. SKIN: Negative for lesions, rash PSYCH: Negative for sleep disturbance, mood disorder and recent psychosocial stressors. HEMATOLOGY Negative for prolonged bleeding, bruising easily, and swollen nodes. ENDOCRINE: Negative for cold or heat intolerance, polyuria, polydipsia and goiter. NEURO: negative for tremor, gait imbalance, syncope and seizures. The remainder of the review of systems is noncontributory.   Physical Exam: BP 120/79 (BP Location: Left Arm, Patient Position: Sitting, Cuff Size: Large)   Pulse 65   Temp Aaron Aas)  97.1 F (36.2 C) (Temporal)   Ht 5\' 5"  (1.651 m)   Wt 240 lb 14.4 oz (109.3 kg)   BMI 40.09 kg/m  GENERAL: The patient is AO x3, in no acute distress. HEENT: Head is normocephalic and atraumatic. EOMI are intact. Mouth is  well hydrated and without lesions. NECK: Supple. No masses LUNGS: Clear to auscultation. No presence of rhonchi/wheezing/rales. Adequate chest expansion HEART: RRR, normal s1 and s2. ABDOMEN: Tender to palpation in the lower abdominal area, no guarding, no peritoneal signs, and nondistended. BS +. No masses. EXTREMITIES: Without any cyanosis, clubbing, rash, lesions or edema. NEUROLOGIC: AOx3, no focal motor deficit. SKIN: no jaundice, no rashes  Imaging/Labs: as above  I personally reviewed and interpreted the available labs, imaging and endoscopic files.  Impression and Plan: Kathryn Rodriguez is a 48 y.o. female with PMH abdominal pain, anxiety, asthma, COPD, depression, high cholesterol, pelvic floor dysfunction,  who presents for follow up of chronic abdominal pain.  Patient has presented chronic abdominal pain for multiple years with extensive cross-sectional abdominal imaging and endoscopic investigations that have not shown any significant abnormality explaining her symptoms.  It is very likely the symptoms are related to functional etiology.  She has also presented some chronic constipation.  We discussed that improving her bowel regimen may also lead to further relief of her symptoms.  Will start her on Amitiza  today.  She needs to follow closely with pain management for further management of her chronic pain.  In terms of her nausea and vomiting episodes, she needs to continue her current regimen of Zofran  and Phenergan  which has led to relief of her symptomatology.  -Close follow up with pain management -Continue Zofran  as needed for nausea -Can take Phenergan  as needed for breakthrough episodes of nausea despite taking Zofran  -Start taking lubiprostone  24 mcg twice a day -Continue Nexium  40 mg twice a day  All questions were answered.      Samantha Cress, MD Gastroenterology and Hepatology [facility]

## 2023-11-27 ENCOUNTER — Encounter (INDEPENDENT_AMBULATORY_CARE_PROVIDER_SITE_OTHER): Payer: Self-pay | Admitting: Gastroenterology

## 2023-11-30 ENCOUNTER — Other Ambulatory Visit (INDEPENDENT_AMBULATORY_CARE_PROVIDER_SITE_OTHER): Payer: Self-pay | Admitting: Gastroenterology

## 2023-12-15 ENCOUNTER — Encounter (INDEPENDENT_AMBULATORY_CARE_PROVIDER_SITE_OTHER): Payer: Self-pay | Admitting: Gastroenterology

## 2023-12-15 ENCOUNTER — Ambulatory Visit (INDEPENDENT_AMBULATORY_CARE_PROVIDER_SITE_OTHER): Admitting: Gastroenterology

## 2023-12-15 VITALS — BP 128/85 | HR 94 | Temp 98.1°F | Ht 65.0 in | Wt 246.9 lb

## 2023-12-15 DIAGNOSIS — R1084 Generalized abdominal pain: Secondary | ICD-10-CM | POA: Diagnosis not present

## 2023-12-15 DIAGNOSIS — K5900 Constipation, unspecified: Secondary | ICD-10-CM | POA: Diagnosis not present

## 2023-12-15 DIAGNOSIS — R1031 Right lower quadrant pain: Secondary | ICD-10-CM | POA: Diagnosis not present

## 2023-12-15 DIAGNOSIS — K5904 Chronic idiopathic constipation: Secondary | ICD-10-CM

## 2023-12-15 NOTE — Patient Instructions (Signed)
 We will get a CT of your abdomen to evaluate pain and swelling and look at blood flow to the intestines  Continue to follow with pain management  As discussed, wegovy may be a good tool for you to help with weight loss along with diet changes and lifestyle modifications, but you need to be aware this may worsen your GI issues as well. I will communicate this to your PCP per your request   Follow up 6 months

## 2023-12-15 NOTE — Progress Notes (Signed)
 Referring Provider: Verdie Johann LABOR, DO Primary Care Physician:  Marlee Lynwood NOVAK, MD Primary GI Physician: Dr. Eartha  Chief Complaint  Patient presents with   Follow-up    Patient here today due to having issues with abdominal pain and abdominal swelling. Patient says she has nausea all the time. She is using zofran  and or promethazine .    HPI:   Kathryn Rodriguez is a 48 y.o. female with past medical history of  abdominal pain, anxiety, asthma, COPD, depression, high cholesterol, pelvic floor dysfunction   Patient presenting today for:  Chronic Abdominal pain RLQ pain/swelling  Constipation   Last seen in may by Dr. Eartha, at that time reports mid abdominal pain since 2017, currently on dilaudid 2mg  Q6h and burprenorphine 450mcg, reports an upcoming dosage increase (pain management). Having a BM every few days, sometimes weekly, taking stool softener. On nexium  BID which controls her GERD  Recommended zofran  PRN, continue to follow with pain management, phenergan  for breakthrough nausea, start amtiza 24mcg BID, nexium  40mg  BID  Present:  States she started noticing swelling to her lower abdomen since around 9/28 when she was trying on dresses for her daughter's wedding. She notes she continues to have chronic abdominal pain she has had for the past few years though swelling is new and feels that pain is worse at times than others. She notes that area stays swollen. She has chronic nausea, which she takes phenergan  or zofran  for depending on severity of her symptoms.  She remains on dilaudid 2mg  every 6 hours and burprenorpine. Constipation is doing better. She has stopped taking stool softeners and is eating apples. She reports she was taking tokelau previously but did not notice much improvement so she stopped it. Feels bowels are moving good now with consuming fruit, going 1-2 times per day with some softer but formed stools, denies any watery stools. No rectal bleeding or  melena. She has gained some weight. Inquires about wegovy as her PCP wanted to get GI input prior to starting her on this. She states appetite is not great, still feels that eating makes her pain worse though it does tend to depend what she eats as she knows certain dairy products/high fat foods worsen her symptoms. She is trying to do protein shakes as well.    MR abd w wo 09/2022  Diminished size of a multilobulated cystic lesion of the superior aspect of the proximal pancreatic tail, measuring 1.8 x 1.7 cm, previously 3.0 x 2.5 cm. No solid component or suspicious contrast enhancement. Given patient age and behavior, this is almost certainly a partially resolved pseudocyst. Given size and very indolent behavior over a long period of follow-up dating back to at least 2019, no further follow-up or characterization is required for this benign lesion. 2. Hepatomegaly and hepatic steatosis. 3. Status post cholecystectomy.   Last Colonoscopy:2023- Two 3 to 6 mm polyps in the ascending colon,                            removed with a cold snare. Resected and retrieved.                           - One 3 mm polyp in the descending colon, removed                            with  a cold snare. Resected and retrieved.                           - The distal rectum and anal verge are normal on                            retroflexion view. (2 TAs)   Last Endoscopy:11/2022- 2 cm hiatal hernia.                           - Normal stomach. Biopsied.                           - Normal examined duodenum. Biopsied. A. SMALL BOWEL, BIOPSY: - Benign small bowel mucosa with no significant pathologic changes   B. GASTRIC, BIOPSY: - Mild reactive gastropathy. - Negative for H. pylori on HE stain - No intestinal metaplasia, dysplasia, or malignancy.     Past Medical History:  Diagnosis Date   Abdominal pain, epigastric 10/19/2017   Anxiety    Asthma    COPD (chronic obstructive pulmonary disease) (HCC)     Depression    High cholesterol    Migraines    Pelvic floor dysfunction     Past Surgical History:  Procedure Laterality Date   BIOPSY  10/26/2017   Procedure: BIOPSY;  Surgeon: Golda Claudis PENNER, MD;  Location: [facility];  Service: Endoscopy;;  biopsy from GE junction   BIOPSY  11/14/2022   Procedure: BIOPSY;  Surgeon: Eartha Angelia Sieving, MD;  Location: [facility];  Service: Gastroenterology;;   c sections     x 3   CHOLECYSTECTOMY     COLONOSCOPY WITH PROPOFOL  N/A 01/15/2022   Procedure: COLONOSCOPY WITH PROPOFOL ;  Surgeon: Eartha Angelia Sieving, MD;  Location: [facility];  Service: Gastroenterology;  Laterality: N/A;  805 ASA 2   complete hysterectomy     ESOPHAGOGASTRODUODENOSCOPY N/A 10/26/2017   Procedure: ESOPHAGOGASTRODUODENOSCOPY (EGD);  Surgeon: Golda Claudis PENNER, MD;  Location: [facility];  Service: Endoscopy;  Laterality: N/A;  12:25   ESOPHAGOGASTRODUODENOSCOPY N/A 11/18/2017   Procedure: ESOPHAGOGASTRODUODENOSCOPY (EGD);  Surgeon: Wilhelmenia Aloha Raddle., MD;  Location: [facility];  Service: Gastroenterology;  Laterality: N/A;   ESOPHAGOGASTRODUODENOSCOPY (EGD) WITH PROPOFOL  N/A 11/14/2022   Procedure: ESOPHAGOGASTRODUODENOSCOPY (EGD) WITH PROPOFOL ;  Surgeon: Eartha Angelia Sieving, MD;  Location: [facility];  Service: Gastroenterology;  Laterality: N/A;  9:30 am, asa 2   EUS N/A 11/18/2017   Procedure: UPPER ENDOSCOPIC ULTRASOUND (EUS) LINEAR;  Surgeon: Wilhelmenia Aloha Raddle., MD;  Location: [facility];  Service: Gastroenterology;  Laterality: N/A;   FINE NEEDLE ASPIRATION  11/18/2017   Procedure: FINE NEEDLE ASPIRATION (FNA) LINEAR;  Surgeon: Wilhelmenia Aloha Raddle., MD;  Location: [facility];  Service: Gastroenterology;;   POLYPECTOMY  01/15/2022   Procedure: POLYPECTOMY;  Surgeon: Eartha Angelia, Sieving, MD;  Location: [facility];  Service: Gastroenterology;;   TONSILLECTOMY     WISDOM TOOTH EXTRACTION      Current Outpatient  Medications  Medication Sig Dispense Refill   atorvastatin (LIPITOR) 20 MG tablet Take 20 mg by mouth daily.     buprenorphine (BUTRANS) 10 MCG/HR PTWK Place onto the skin once a week.     Carboxymethylcellulose Sodium (EYE DROPS OP) Place 1 drop into both eyes daily as needed (irritation).     cetirizine (ZYRTEC) 10  MG tablet Take 10 mg by mouth at bedtime.      cyclobenzaprine (FLEXERIL) 10 MG tablet Take 10 mg by mouth 3 (three) times daily as needed for muscle spasms.     esomeprazole  (NEXIUM ) 40 MG capsule TAKE ONE CAPSULE BY MOUTH TWICE DAILY BEFORE a meal 60 capsule 3   HYDROmorphone (DILAUDID) 2 MG tablet Take 2 mg by mouth every 6 (six) hours as needed.     ibuprofen (ADVIL) 800 MG tablet Take 800 mg by mouth every 8 (eight) hours as needed.     LORazepam (ATIVAN) 1 MG tablet Take 1 mg by mouth every 8 (eight) hours. (Patient taking differently: Take 1 mg by mouth at bedtime.)     ondansetron  (ZOFRAN ) 8 MG tablet Take 8 mg by mouth every 8 (eight) hours as needed for nausea or vomiting.     promethazine  (PHENERGAN ) 12.5 MG tablet Take 1 tablet (12.5 mg total) by mouth every 8 (eight) hours as needed for nausea or vomiting. 30 tablet 2   SEREVENT DISKUS 50 MCG/DOSE diskus inhaler Inhale 2 puffs into the lungs daily as needed (shortness of breath).  11   topiramate (TOPAMAX) 200 MG tablet Take 200 mg by mouth 2 (two) times daily.     venlafaxine (EFFEXOR) 100 MG tablet Take 100 mg by mouth 2 (two) times daily.     VENTOLIN HFA 108 (90 Base) MCG/ACT inhaler Inhale 2 puffs into the lungs every 4 (four) hours as needed for wheezing.   11   diclofenac Sodium (VOLTAREN) 1 % GEL Apply 1 Application topically 4 (four) times daily as needed (pain). (Patient not taking: Reported on 12/15/2023)     No current facility-administered medications for this visit.    Allergies as of 12/15/2023 - Review Complete 12/15/2023  Allergen Reaction Noted   Amoxicillin Hives and Itching 01/13/2022   Cefuroxime  Hives and Itching 01/13/2022    Social History   Socioeconomic History   Marital status: Significant Other    Spouse name: Not on file   Number of children: Not on file   Years of education: Not on file   Highest education level: Not on file  Occupational History   Not on file  Tobacco Use   Smoking status: Every Day    Current packs/day: 1.00    Average packs/day: 1 pack/day for 32.0 years (32.0 ttl pk-yrs)    Types: Cigarettes    Passive exposure: Current   Smokeless tobacco: Never   Tobacco comments:    1 pack day since age 28  Vaping Use   Vaping status: Some Days  Substance and Sexual Activity   Alcohol use: Yes    Comment: rarely   Drug use: Not Currently   Sexual activity: Not on file  Other Topics Concern   Not on file  Social History Narrative   Left handed   Lives with boyfriend   Caffeine use: 2-2 drinks per day   Social Drivers of Health   Financial Resource Strain: Low Risk  (07/10/2023)   Received from [facility]   Overall Financial Resource Strain (CARDIA)    Difficulty of Paying Living Expenses: Not very hard  Food Insecurity: No Food Insecurity (12/02/2023)   Received from [facility]   Hunger Vital Sign    Within the past 12 months, you worried that your food would run out before you got the money to buy more.: Never true    Within the past 12 months, the food you  bought just didn't last and you didn't have money to get more.: Never true  Transportation Needs: No Transportation Needs (12/02/2023)   Received from [facility] - Transportation    Lack of Transportation (Medical): No    Lack of Transportation (Non-Medical): No  Physical Activity: Inactive (11/05/2022)   Received from [facility]   Exercise Vital Sign    On average, how many days per week do you engage in moderate to strenuous exercise (like a brisk walk)?: 0 days    On average, how many minutes do you engage in exercise at this level?: 0 min  Stress: No  Stress Concern Present (11/18/2022)   Received from [facility] - Occupational Stress Questionnaire    Feeling of Stress : Not at all  Recent Concern: Stress - Stress Concern Present (11/05/2022)   Received from [facility] of Occupational Health - Occupational Stress Questionnaire    Feeling of Stress : Rather much  Social Connections: Moderately Integrated (11/05/2022)   Received from [facility]   Social Connection and Isolation Panel    In a typical week, how many times do you talk on the phone with family, friends, or neighbors?: Three times a week    How often do you get together with friends or relatives?: Twice a week    How often do you attend church or religious services?: 1 to 4 times per year    Do you belong to any clubs or organizations such as church groups, unions, fraternal or athletic groups, or school groups?: No    How often do you attend meetings of the clubs or organizations you belong to?: More than 4 times per year    Are you married, widowed, divorced, separated, never married, or living with a partner?: Never married    Review of systems General: negative for malaise, night sweats, fever, chills, weight loss Neck: Negative for lumps, goiter, pain and significant neck swelling Resp: Negative for cough, wheezing, dyspnea at rest CV: Negative for chest pain, leg swelling, palpitations, orthopnea GI: denies melena, hematochezia, nausea, vomiting, diarrhea, constipation, dysphagia, odyonophagia, early satiety or unintentional weight loss. +chronic abdominal pain, worse postprandial +RLQ pain/swelling  MSK: Negative for joint pain or swelling, back pain, and muscle pain. Derm: Negative for itching or rash Psych: Denies depression, anxiety, memory loss, confusion. No homicidal or suicidal ideation.  Heme: Negative for prolonged bleeding, bruising easily, and swollen nodes. Endocrine: Negative for  cold or heat intolerance, polyuria, polydipsia and goiter. Neuro: negative for tremor, gait imbalance, syncope and seizures. The remainder of the review of systems is noncontributory.  Physical Exam: BP 128/85 (BP Location: Left Arm, Patient Position: Sitting, Cuff Size: Large)   Pulse 94   Temp 98.1 F (36.7 C) (Temporal)   Ht 5' 5 (1.651 m)   Wt 246 lb 14.4 oz (112 kg)   BMI 41.09 kg/m  General:   Alert and oriented. No distress noted. Pleasant and cooperative.  Head:  Normocephalic and atraumatic. Eyes:  Conjuctiva clear without scleral icterus. Mouth:  Oral mucosa pink and moist. Good dentition. No lesions. Heart: Normal rate and rhythm, s1 and s2 heart sounds present.  Lungs: Clear lung sounds in all lobes. Respirations equal and unlabored. Abdomen:  +BS, soft,  and non-distended. No rebound or guarding. No HSM or masses noted. Abdomen is generally tender, no lesions or masses noted, symmetrical on exam in supine  position, some asymmetry of RLQ noted only upon standing, query a hernia  Derm: No palmar erythema or jaundice Msk:  Symmetrical without gross deformities. Normal posture. Extremities:  Without edema. Neurologic:  Alert and  oriented x4 Psych:  Alert and cooperative. Normal mood and affect.  Invalid input(s): 6 MONTHS   ASSESSMENT: Kathryn Rodriguez is a 48 y.o. female presenting today for chronic abdominal pain, RLQ Pain/swelling and constipation  Acute on Chronic abdominal pain with RLQ pain/swelling: Chronic for the past 5 to 6 years. Patient has had thorough workup to include upper endoscopy, colonoscopy MRI and CT abdomen pelvis with contrast without findings to explain her symptoms. She is on high-dose opiates to include Dilaudid and states that pain is still consistent. She was recommended to have CT Angio in the past due to pain worsening after eating though she could not afford the OOP cost for this.   She reports worsening pain over the past few weeks as  well as swelling to RLQ, which is notably only seen on standing, query if she has a hernia present as I do not feel anything obvious such as a mass on exam. Given worsening of pain and RLQ swelling, would recommend updating cross sectional imaging, will obtain CT angio as she has different insurance now and while I have a low suspicion for CMI given no weight loss, evaluation to rule out mesenteric ischemia will complete the evaluation for her chronic abdominal pain.   Constipation: seems to be doing well with higher intake of fruit in diet. Not taking anything currently and having a BM 1-2 times per day. Denies rectal bleeding or melena. Should continue with current regimen, good water intake.   Patient inquired about wegovy for weight loss which I discussed with her the risks and benefits of. Georjean should be used as a tool for weight loss along with diety and lifestyle modifications. Most patients benefit from meeting with a dietician as well to help better manage their diet.  My biggest concern for her would be worsening of her already chronic GI issues to include abdominal pain, nausea and constipation, however the patient states she is aware of these side effects and is willing to make changes to accommodate. She asked that I communicate my recommendations to her PCP. I will communicate my discussion regarding GLP1  injection for weight loss with Dr. Marlee.    PLAN:  -CT Angio w wo contrast -continue to follow with pain management -I will communicate my discussion regarding GLP1 with patient's PCP per her request -continue with high fruit intake, high fiber, good water intake for constipation -phenergan  for severe nausea breakthrough, zofran  otherwise for less severe nausea, PRN  All questions were answered, patient verbalized understanding and is in agreement with plan as outlined above.   Follow Up: 6 months   Jeronda Don L. Mariette, MSN, APRN, AGNP-C Adult-Gerontology Nurse  Practitioner [facility]  I have reviewed the note and agree with the APP's assessment as described in this progress note  Toribio Fortune, MD Gastroenterology and Hepatology [facility]enter[facility]

## 2023-12-16 ENCOUNTER — Telehealth (INDEPENDENT_AMBULATORY_CARE_PROVIDER_SITE_OTHER): Payer: Self-pay

## 2023-12-16 NOTE — Telephone Encounter (Signed)
 PA on RADMD for CT Angio Tracking:[id number] Request Date: 12/16/2023 05:42 AM  Status: In Review

## 2023-12-23 ENCOUNTER — Encounter (INDEPENDENT_AMBULATORY_CARE_PROVIDER_SITE_OTHER): Payer: Self-pay | Admitting: Gastroenterology

## 2023-12-30 NOTE — Telephone Encounter (Signed)
 PA approved by RADMD:  Request #: [id number] Tracking #: [id number] Dates of service: 12/16/2023-02/14/2024

## 2023-12-30 NOTE — Telephone Encounter (Signed)
 Spoke with patient, scheduled CT for 02/02/2024 at 2:00pm. Patient aware and verbalized understanding.

## 2024-02-01 NOTE — Telephone Encounter (Signed)
 Consultation with orthopedic surgeon Dr. Heyward who has seen imaging and agrees with the following plan.  Patient speaking with provider still having increased hip pain.  We did discuss potential etiologies again for hip pain.  Hip MRI has been ordered.  At this time we will order ESR and CRP for further evaluation though examination by myself and by Dr. Derick has not been consistent with diagnosis of septic hip.  If patient's pain becomes too great recommend she present to ED for evaluation and treatment.  Recommend patient reach out for any increased concerns problems questions or complaints.  Patient understood and agreed with this plan.

## 2024-02-02 ENCOUNTER — Ambulatory Visit (HOSPITAL_COMMUNITY)
Admission: RE | Admit: 2024-02-02 | Discharge: 2024-02-02 | Disposition: A | Discharge status: Home / Self Care | Source: Ambulatory Visit | Attending: Gastroenterology | Admitting: Gastroenterology

## 2024-02-02 DIAGNOSIS — R1084 Generalized abdominal pain: Secondary | ICD-10-CM | POA: Insufficient documentation

## 2024-02-02 DIAGNOSIS — R1031 Right lower quadrant pain: Secondary | ICD-10-CM | POA: Diagnosis present

## 2024-02-02 MED ORDER — IOHEXOL 350 MG/ML SOLN: 100.0000 mL | Freq: Once | INTRAVENOUS | Status: DC | PRN

## 2024-02-02 MED ORDER — IOHEXOL 300 MG/ML  SOLN
100.0000 mL | Freq: Once | INTRAMUSCULAR | Status: AC | PRN
  Administered 2024-02-02: 100 mL via INTRAVENOUS

## 2024-02-08 ENCOUNTER — Ambulatory Visit (INDEPENDENT_AMBULATORY_CARE_PROVIDER_SITE_OTHER): Payer: Self-pay | Admitting: Gastroenterology

## 2024-04-04 ENCOUNTER — Other Ambulatory Visit (INDEPENDENT_AMBULATORY_CARE_PROVIDER_SITE_OTHER): Payer: Self-pay | Admitting: Gastroenterology
